# Patient Record
Sex: Female | Born: 1960 | Race: Black or African American | Hispanic: No | Marital: Married | Smoking: Never smoker
Health system: Southern US, Community
[De-identification: ages and names within clinical notes are randomized; demographics above are authoritative.]

## PROBLEM LIST (undated history)

## (undated) DIAGNOSIS — E785 Hyperlipidemia, unspecified: Secondary | ICD-10-CM

## (undated) DIAGNOSIS — I10 Essential (primary) hypertension: Secondary | ICD-10-CM

## (undated) DIAGNOSIS — Z9189 Other specified personal risk factors, not elsewhere classified: Secondary | ICD-10-CM

## (undated) HISTORY — PX: CHOLECYSTECTOMY: SHX55

## (undated) HISTORY — DX: Hyperlipidemia, unspecified: E78.5

## (undated) HISTORY — PX: ABDOMINAL HYSTERECTOMY: SHX81

## (undated) HISTORY — DX: Essential (primary) hypertension: I10

---

## 2013-04-30 LAB — HM MAMMOGRAPHY

## 2013-05-07 ENCOUNTER — Encounter: Payer: Self-pay | Admitting: Physician Assistant

## 2013-05-07 ENCOUNTER — Ambulatory Visit (INDEPENDENT_AMBULATORY_CARE_PROVIDER_SITE_OTHER): Payer: BC Managed Care – PPO | Admitting: Physician Assistant

## 2013-05-07 VITALS — BP 129/80 | HR 92 | Ht 64.0 in | Wt 211.0 lb

## 2013-05-07 DIAGNOSIS — E669 Obesity, unspecified: Secondary | ICD-10-CM

## 2013-05-07 DIAGNOSIS — E119 Type 2 diabetes mellitus without complications: Secondary | ICD-10-CM

## 2013-05-07 DIAGNOSIS — I10 Essential (primary) hypertension: Secondary | ICD-10-CM

## 2013-05-07 DIAGNOSIS — Z860100 Personal history of colon polyps, unspecified: Secondary | ICD-10-CM

## 2013-05-07 DIAGNOSIS — E785 Hyperlipidemia, unspecified: Secondary | ICD-10-CM

## 2013-05-07 DIAGNOSIS — R631 Polydipsia: Secondary | ICD-10-CM

## 2013-05-07 DIAGNOSIS — R7301 Impaired fasting glucose: Secondary | ICD-10-CM

## 2013-05-07 LAB — POCT GLYCOSYLATED HEMOGLOBIN (HGB A1C): HEMOGLOBIN A1C: 6.7

## 2013-05-07 NOTE — Patient Instructions (Signed)
Will refer to nutritionist.

## 2013-05-07 NOTE — Progress Notes (Signed)
   Subjective:    Patient ID: Brianna Walton, female    DOB: [birth year], 53 y.o.   MRN: [id number]  HPI Pt is a 53 yo female who presents to clinic to establish care. She just moved here from [place], [place]. She recently had CPE but feels like she needs to follow up on a few things. Her fasting glucose was elevated at 120 and her A1C was 6.1 a few months back. 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 She has noticed she feels thristy all the time.   Labs have been drawn LDL 130, HDL 36. Mammogram and Pap UPT. Colonoscopy done 2012. Polyps found recheck every 5 years.   . Active Ambulatory Problems    Diagnosis Date Noted  . Personal history of colonic polyps 05/07/2013  . Obesity, unspecified 05/08/2013  . Diabetes mellitus, type 2 05/08/2013  . Essential hypertension, benign 05/08/2013   Resolved Ambulatory Problems    Diagnosis Date Noted  . No Resolved Ambulatory Problems   Past Medical History  Diagnosis Date  . Hypertension   . Hyperlipidemia    . History   Social History  . Marital Status: Married    Spouse Name: N/A    Number of Children: N/A  . Years of Education: N/A   Occupational History  . Not on file.   Social History Main Topics  . Smoking status: Never Smoker   . Smokeless tobacco: Not on file  . Alcohol Use: No  . Drug Use: No  . Sexual Activity: Yes   Other Topics Concern  . Not on file   Social History Narrative  . No narrative on file   . Family History  Problem Relation Age of Onset  . Diabetes Mother   . Hypertension Mother   . Hypertension Father       Review of Systems  All other systems reviewed and are negative.      Objective:   Physical Exam  Constitutional: She is oriented to person, place, and time. She appears well-developed and well-nourished.  Obesity.   HENT:  Head: Normocephalic and atraumatic.  Cardiovascular: Normal rate, regular rhythm and normal heart sounds.   Pulmonary/Chest:  Effort normal and breath sounds normal.  Neurological: She is alert and oriented to person, place, and time.  Psychiatric: She has a normal mood and affect. Her behavior is normal.          Assessment & Plan:  Diabetes Mellitus- . Lab Results  Component Value Date   HGBA1C 6.7 05/07/2013  This has increased since last checked. Would like to start metformin 500mg  bid. Pt mentioned her kidney function was elevated at one point. Will check CMP first and then call in accordingly. Nutrition is a big component of diabetes. Will send to nutrition counseling. Follow up in 3 months    HTN- BP looks good today. Ok for refill for 6 months.   Vitamin D deficiency- will recheck today. Been on Vitamin d 50000 unitis for last 3-6 months.    Hyperlipidemia- due to new dx of diabetes need to consider statin for LDL reduction. Will discuss at next appt.

## 2013-05-08 ENCOUNTER — Telehealth: Payer: Self-pay | Admitting: Physician Assistant

## 2013-05-08 DIAGNOSIS — I10 Essential (primary) hypertension: Secondary | ICD-10-CM | POA: Insufficient documentation

## 2013-05-08 DIAGNOSIS — E669 Obesity, unspecified: Secondary | ICD-10-CM | POA: Insufficient documentation

## 2013-05-08 DIAGNOSIS — R7303 Prediabetes: Secondary | ICD-10-CM | POA: Insufficient documentation

## 2013-05-08 DIAGNOSIS — E785 Hyperlipidemia, unspecified: Secondary | ICD-10-CM | POA: Insufficient documentation

## 2013-05-08 LAB — COMPLETE METABOLIC PANEL WITH GFR
ALBUMIN: 4.5 g/dL (ref 3.5–5.2)
ALK PHOS: 91 U/L (ref 39–117)
ALT: 20 U/L (ref 0–35)
AST: 22 U/L (ref 0–37)
BILIRUBIN TOTAL: 0.6 mg/dL (ref 0.2–1.2)
BUN: 11 mg/dL (ref 6–23)
CO2: 25 mEq/L (ref 19–32)
Calcium: 9.5 mg/dL (ref 8.4–10.5)
Chloride: 100 mEq/L (ref 96–112)
Creat: 0.93 mg/dL (ref 0.50–1.10)
GFR, Est African American: 81 mL/min
GFR, Est Non African American: 70 mL/min
Glucose, Bld: 95 mg/dL (ref 70–99)
Potassium: 3.4 mEq/L — ABNORMAL LOW (ref 3.5–5.3)
SODIUM: 138 meq/L (ref 135–145)
Total Protein: 7.6 g/dL (ref 6.0–8.3)

## 2013-05-08 LAB — VITAMIN D 25 HYDROXY (VIT D DEFICIENCY, FRACTURES): Vit D, 25-Hydroxy: 54 ng/mL (ref 30–89)

## 2013-05-08 MED ORDER — METFORMIN HCL 500 MG PO TABS: 500.0000 mg | ORAL_TABLET | Freq: Two times a day (BID) | ORAL | Status: DC

## 2013-05-08 NOTE — Telephone Encounter (Signed)
Add to result note. Will send metformin since kidney function was fine. May have some GI side effects but if any muscle aches please call office.

## 2013-06-28 ENCOUNTER — Telehealth: Payer: Self-pay | Admitting: *Deleted

## 2013-06-28 NOTE — Telephone Encounter (Signed)
Stop metformin. See if sensation goes away. Let me know on Monday.

## 2013-06-28 NOTE — Telephone Encounter (Signed)
Left detailed message on vm.

## 2013-06-28 NOTE — Telephone Encounter (Signed)
Pt calls stating that ever since she's started the metformin she's experiencing tingling in her knees, especially when she stands up.  She said it "feels like bugs are crawling around".

## 2013-07-04 ENCOUNTER — Telehealth: Payer: Self-pay | Admitting: *Deleted

## 2013-07-04 NOTE — Telephone Encounter (Signed)
Pt called & left a vm stating that after stopping the metformin, she is still having the tingling sensation in her knees.

## 2013-07-05 NOTE — Telephone Encounter (Signed)
Forwarding to [employer], PA-C

## 2013-07-08 NOTE — Telephone Encounter (Signed)
Left detailed message on vm notifying pt.

## 2013-07-08 NOTE — Telephone Encounter (Signed)
Very unusal symptom. i would definetly start back on metformin. Have you started on any other new medication or even OTC. Follow up with appt to discuss in more detail. Could certainly get blood work to look for any other causes.

## 2013-07-10 ENCOUNTER — Ambulatory Visit (INDEPENDENT_AMBULATORY_CARE_PROVIDER_SITE_OTHER): Payer: Federal, State, Local not specified - PPO | Admitting: Physician Assistant

## 2013-07-10 ENCOUNTER — Ambulatory Visit (INDEPENDENT_AMBULATORY_CARE_PROVIDER_SITE_OTHER): Payer: Federal, State, Local not specified - PPO

## 2013-07-10 ENCOUNTER — Encounter: Payer: Self-pay | Admitting: Physician Assistant

## 2013-07-10 ENCOUNTER — Other Ambulatory Visit: Payer: Self-pay | Admitting: Physician Assistant

## 2013-07-10 VITALS — BP 125/82 | HR 101 | Ht 64.0 in | Wt 208.0 lb

## 2013-07-10 DIAGNOSIS — R202 Paresthesia of skin: Secondary | ICD-10-CM

## 2013-07-10 DIAGNOSIS — M25562 Pain in left knee: Principal | ICD-10-CM

## 2013-07-10 DIAGNOSIS — M25561 Pain in right knee: Secondary | ICD-10-CM

## 2013-07-10 DIAGNOSIS — R209 Unspecified disturbances of skin sensation: Secondary | ICD-10-CM

## 2013-07-10 DIAGNOSIS — M25569 Pain in unspecified knee: Secondary | ICD-10-CM

## 2013-07-10 LAB — CBC
HCT: 40.3 % (ref 36.0–46.0)
HEMOGLOBIN: 13.4 g/dL (ref 12.0–15.0)
MCH: 24 pg — AB (ref 26.0–34.0)
MCHC: 33.3 g/dL (ref 30.0–36.0)
MCV: 72.2 fL — ABNORMAL LOW (ref 78.0–100.0)
Platelets: 402 10*3/uL — ABNORMAL HIGH (ref 150–400)
RBC: 5.58 MIL/uL — AB (ref 3.87–5.11)
RDW: 15.8 % — ABNORMAL HIGH (ref 11.5–15.5)
WBC: 8.8 10*3/uL (ref 4.0–10.5)

## 2013-07-10 LAB — FOLATE: FOLATE: 7.3 ng/mL

## 2013-07-10 LAB — BASIC METABOLIC PANEL WITH GFR
BUN: 14 mg/dL (ref 6–23)
CALCIUM: 9.4 mg/dL (ref 8.4–10.5)
CHLORIDE: 101 meq/L (ref 96–112)
CO2: 29 meq/L (ref 19–32)
Creat: 1.1 mg/dL (ref 0.50–1.10)
GFR, Est African American: 66 mL/min
GFR, Est Non African American: 57 mL/min — ABNORMAL LOW
Glucose, Bld: 109 mg/dL — ABNORMAL HIGH (ref 70–99)
POTASSIUM: 3.3 meq/L — AB (ref 3.5–5.3)
SODIUM: 138 meq/L (ref 135–145)

## 2013-07-10 LAB — VITAMIN B12: Vitamin B-12: 1769 pg/mL — ABNORMAL HIGH (ref 211–911)

## 2013-07-10 LAB — TSH: TSH: 2.55 u[IU]/mL (ref 0.350–4.500)

## 2013-07-15 NOTE — Progress Notes (Signed)
   Subjective:    Patient ID: Brianna Walton, female    DOB: 09-28-1960, 53 y.o.   MRN: [id number]  HPI Pt is a 53 yo female who presents to the clinic with tingling in both knees. Start about 2 weeks ago after starting metformin for diabetes. She stopped metformin but both knees still tingle. Nothing makes better or worse. Denies any injury or pain. Sensation is constant.    Review of Systems  All other systems reviewed and are negative.      Objective:   Physical Exam  Constitutional: She is oriented to person, place, and time. She appears well-developed and well-nourished.  HENT:  Head: Normocephalic and atraumatic.  Cardiovascular: Normal rate, regular rhythm and normal heart sounds.   Pulmonary/Chest: Effort normal and breath sounds normal. She has no wheezes.  Musculoskeletal:  NROM of bilateral knees.  No pain with palpation bilaterally.  No swelling or deformity.  No joint tenderness bilaterally.  Strength 5/5.  Negative mcmurrays.  Bilateral knees stable with no laxity.   Neurological: She is alert and oriented to person, place, and time.  Skin: Skin is dry.  Psychiatric: She has a normal mood and affect. Her behavior is normal.          Assessment & Plan:  Tingling in knees- we originally thought tingling was coming from metformin. Pt has stopped for 2 weeks. Will check labs for b12, cbc, TSH, bmp. Will get knee xrays. Follow up in 2 weeks. Discussed trying neurontin or getting EMG's of legs.

## 2013-07-31 ENCOUNTER — Encounter: Payer: BC Managed Care – PPO | Admitting: Physician Assistant

## 2013-07-31 ENCOUNTER — Telehealth: Payer: Self-pay | Admitting: Physician Assistant

## 2013-07-31 NOTE — Progress Notes (Signed)
This encounter was created in error - please disregard.

## 2013-07-31 NOTE — Telephone Encounter (Signed)
Call pt: missed appt today. see if tingling in knees has improved. Due for A1C check.

## 2013-08-07 ENCOUNTER — Ambulatory Visit: Payer: BC Managed Care – PPO | Admitting: Physician Assistant

## 2013-08-08 ENCOUNTER — Encounter: Payer: Self-pay | Admitting: Sports Medicine

## 2013-08-08 ENCOUNTER — Ambulatory Visit (INDEPENDENT_AMBULATORY_CARE_PROVIDER_SITE_OTHER): Payer: Federal, State, Local not specified - PPO | Admitting: Sports Medicine

## 2013-08-08 ENCOUNTER — Ambulatory Visit (INDEPENDENT_AMBULATORY_CARE_PROVIDER_SITE_OTHER): Payer: Federal, State, Local not specified - PPO

## 2013-08-08 VITALS — BP 145/98 | HR 105 | Ht 64.0 in | Wt 207.0 lb

## 2013-08-08 DIAGNOSIS — M5416 Radiculopathy, lumbar region: Secondary | ICD-10-CM

## 2013-08-08 DIAGNOSIS — IMO0002 Reserved for concepts with insufficient information to code with codable children: Secondary | ICD-10-CM

## 2013-08-08 DIAGNOSIS — M545 Low back pain, unspecified: Secondary | ICD-10-CM

## 2013-08-08 MED ORDER — PREDNISONE 50 MG PO TABS: ORAL_TABLET | ORAL | Status: DC

## 2013-08-08 MED ORDER — MELOXICAM 15 MG PO TABS: ORAL_TABLET | ORAL | Status: DC

## 2013-08-08 NOTE — Assessment & Plan Note (Signed)
Steffani has classic L4 bilateral radicular symptoms with pain in her back and symptoms are worse in a seated position. Prednisone, Mobic, x-rays, formal PT. She will likely have L4-L5 degenerative disc disease. Return in 4 weeks If no better in a month we will proceed with an MRI.

## 2013-08-08 NOTE — Progress Notes (Signed)
   Subjective:    I'm seeing this patient as a consultation for:  Iran Planas, PA-C  CC: Knee tingling  HPI: This is a pleasant 53 year old female, for the past several months she's had tingling in the anterior aspect of the knees, she saw her PCP who ordered knee x-rays and some blood work which was negative. She was referred to me for further evaluation and definitive treatment. She describes her sensation is tingling on the anterior aspect of the knee with occasional radiation from the back, worse when sitting for long periods of time and worse with driving. She does have occasional back soreness. No bowel or bladder dysfunction or constitutional symptoms. Symptoms are mild, persistent.  Past medical history, Surgical history, Family history not pertinant except as noted below, Social history, Allergies, and medications have been entered into the medical record, reviewed, and no changes needed.   Review of Systems: No headache, visual changes, nausea, vomiting, diarrhea, constipation, dizziness, abdominal pain, skin rash, fevers, chills, night sweats, weight loss, swollen lymph nodes, body aches, joint swelling, muscle aches, chest pain, shortness of breath, mood changes, visual or auditory hallucinations.   Objective:   General: Well Developed, well nourished, and in no acute distress.  Neuro/Psych: Alert and oriented x3, extra-ocular muscles intact, able to move all 4 extremities, sensation grossly intact. Skin: Warm and dry, no rashes noted.  Respiratory: Not using accessory muscles, speaking in full sentences, trachea midline.  Cardiovascular: Pulses palpable, no extremity edema. Abdomen: Does not appear distended. Back Exam:  Inspection: Unremarkable  Motion: Flexion 45 deg, Extension 45 deg, Side Bending to 45 deg bilaterally,  Rotation to 45 deg bilaterally  SLR laying: Negative  XSLR laying: Negative  Palpable tenderness: None. FABER: negative. Sensory change: Gross  sensation intact to all lumbar and sacral dermatomes.  Reflexes: 2+ at both patellar tendons, 2+ at achilles tendons, Babinski's downgoing.  Strength at foot  Plantar-flexion: 5/5 Dorsi-flexion: 5/5 Eversion: 5/5 Inversion: 5/5  Leg strength  Quad: 5/5 Hamstring: 5/5 Hip flexor: 5/5 Hip abductors: 5/5  Gait unremarkable.  X-rays reviewed and showed a worse changes in the upper lumbar segments.  Impression and Recommendations:   This case required medical decision making of moderate complexity.

## 2013-08-08 NOTE — Telephone Encounter (Signed)
Pt has appt

## 2013-09-05 ENCOUNTER — Ambulatory Visit: Payer: Federal, State, Local not specified - PPO | Admitting: Sports Medicine

## 2013-09-13 ENCOUNTER — Ambulatory Visit: Payer: Federal, State, Local not specified - PPO | Admitting: Sports Medicine

## 2013-09-13 DIAGNOSIS — Z0289 Encounter for other administrative examinations: Secondary | ICD-10-CM

## 2013-10-15 ENCOUNTER — Other Ambulatory Visit: Payer: Self-pay | Admitting: Physician Assistant

## 2013-11-14 ENCOUNTER — Other Ambulatory Visit: Payer: Self-pay | Admitting: Physician Assistant

## 2013-11-15 ENCOUNTER — Other Ambulatory Visit: Payer: Self-pay | Admitting: Physician Assistant

## 2013-11-17 ENCOUNTER — Other Ambulatory Visit: Payer: Self-pay | Admitting: Physician Assistant

## 2013-12-31 ENCOUNTER — Encounter: Payer: Self-pay | Admitting: Physician Assistant

## 2013-12-31 ENCOUNTER — Ambulatory Visit (INDEPENDENT_AMBULATORY_CARE_PROVIDER_SITE_OTHER): Payer: Federal, State, Local not specified - PPO | Admitting: Physician Assistant

## 2013-12-31 VITALS — BP 144/99 | HR 100 | Ht 64.0 in | Wt 212.0 lb

## 2013-12-31 DIAGNOSIS — R809 Proteinuria, unspecified: Secondary | ICD-10-CM | POA: Diagnosis not present

## 2013-12-31 DIAGNOSIS — E876 Hypokalemia: Secondary | ICD-10-CM | POA: Diagnosis not present

## 2013-12-31 DIAGNOSIS — R319 Hematuria, unspecified: Secondary | ICD-10-CM

## 2013-12-31 DIAGNOSIS — R7989 Other specified abnormal findings of blood chemistry: Secondary | ICD-10-CM

## 2013-12-31 DIAGNOSIS — R748 Abnormal levels of other serum enzymes: Secondary | ICD-10-CM | POA: Diagnosis not present

## 2013-12-31 LAB — BASIC METABOLIC PANEL WITH GFR
BUN: 14 mg/dL (ref 6–23)
CALCIUM: 9.3 mg/dL (ref 8.4–10.5)
CO2: 25 mEq/L (ref 19–32)
Chloride: 101 mEq/L (ref 96–112)
Creat: 1.06 mg/dL (ref 0.50–1.10)
GFR, Est African American: 69 mL/min
GFR, Est Non African American: 60 mL/min
GLUCOSE: 116 mg/dL — AB (ref 70–99)
POTASSIUM: 3.8 meq/L (ref 3.5–5.3)
SODIUM: 138 meq/L (ref 135–145)

## 2013-12-31 LAB — POCT URINALYSIS DIPSTICK
BILIRUBIN UA: NEGATIVE
Glucose, UA: NEGATIVE
KETONES UA: NEGATIVE
LEUKOCYTES UA: NEGATIVE
NITRITE UA: NEGATIVE
PH UA: 7
Protein, UA: 100
Spec Grav, UA: 1.015
Urobilinogen, UA: 0.2

## 2013-12-31 LAB — VITAMIN B12: VITAMIN B 12: 813 pg/mL (ref 211–911)

## 2013-12-31 MED ORDER — CLONIDINE HCL 0.1 MG PO TABS: 0.1000 mg | ORAL_TABLET | Freq: Two times a day (BID) | ORAL | Status: DC

## 2013-12-31 MED ORDER — AMLODIPINE BESY-BENAZEPRIL HCL 10-40 MG PO CAPS: ORAL_CAPSULE | ORAL | Status: DC

## 2013-12-31 NOTE — Patient Instructions (Signed)
Proteinuria Proteinuria is a condition in which urine contains more protein than is normal. Proteinuria is either a sign that your body is producing too much protein or a sign that there is a problem with the kidneys. Healthy kidneys prevent most substances that the body needs, including proteins, from leaving the bloodstream and ending up in urine. CAUSES  Proteinuria may be caused by a temporary event or condition such as stress, exercise, or fever, and go away on its own. Proteinuria may also be a symptom of a more serious condition or disease. Causes of proteinuria include:  A kidney disease caused by:  Diabetes.  High blood pressure (hypertension).   A disease that affects the immune system, such as lupus.  A genetic disease, such as Alport's syndrome.  Medicines that damage the kidneys, such as long-term nonsteroidal anti-inflammatory drugs (NSAIDs).  Poisoning or exposure to toxic substances.  A reoccurring kidney or urinary infection.  Excess protein production in the body caused by:  Multiple myeloma.  Amyloidosis. SYMPTOMS You may have proteinuria without having noticeable symptoms. If there is a large amount of protein in your urine, your urine may look foamy. You may also notice swelling (edema) in your hands, feet, abdomen, or face. DIAGNOSIS To determine whether you have proteinuria, you will need to provide a urine sample. Your urine will then be tested for too much protein and the main blood protein albumin. If your test shows that you have proteinuria, you may need to take additional tests to determine its cause, how much protein is in your urine, and what type of protein is being lost. Tests may include:  Blood tests.  Urine tests.  A blood pressure measurement.  Imaging tests. TREATMENT  Treatment will depend on the cause of your proteinuria. Your caregiver will discuss treatment options with you after you have been diagnosed. If your proteinuria is mild or  temporary, no treatment may be necessary. HOME CARE INSTRUCTIONS Ask your caregiver if monitoring the level of protein in your urine at home using simple testing strips is appropriate for you. Early detection of proteinuria can lead to early and often successful treatment of the condition causing it. Document Released: 02/23/2005 Document Revised: 09/28/2011 Document Reviewed: 06/03/2011 Exit[employer] Patient Information 2015 [employer]. This information is not intended to replace advice given to you by your health care provider. Make sure you discuss any questions you have with your health care provider.  

## 2013-12-31 NOTE — Progress Notes (Signed)
   Subjective:    Patient ID: Brianna Walton, female    DOB: 06-04-1960, 53 y.o.   MRN: [id number]  HPI Patient is a 53 year old female who presents to the clinic to follow-up on low potassium and elevated B12 at last visit as well as discuss proteins that she found in her urine when she tested her urine on dipstick. She denies any urinary symptoms. She denies any fever or chills.   Review of Systems  All other systems reviewed and are negative.      Objective:   Physical Exam  Constitutional: She is oriented to person, place, and time. She appears well-developed and well-nourished.  HENT:  Head: Normocephalic and atraumatic.  Cardiovascular: Normal rate, regular rhythm and normal heart sounds.   Pulmonary/Chest: Effort normal and breath sounds normal.  Neurological: She is alert and oriented to person, place, and time.  Psychiatric: She has a normal mood and affect. Her behavior is normal.          Assessment & Plan:  Elevated B12/hypokalemia-we'll recheck labs today.  Proteinuria/hematuria-patient is not symptomatic today will culture urine and get microscopic. Discussed there are multiple causes for protein in urine. Her kidney function look great at last visit will recheck. We'll also recheck urine in 2 weeks. Her blood pressure was elevated today. Discuss hypertension can sometimes also cause protein in urine. Patient is she's taken her blood pressure medicine daily and keeping to a low salt diet.

## 2014-01-01 LAB — URINALYSIS, MICROSCOPIC ONLY
Casts: NONE SEEN
Crystals: NONE SEEN
SQUAMOUS EPITHELIAL / LPF: NONE SEEN

## 2014-01-03 ENCOUNTER — Other Ambulatory Visit: Payer: Self-pay | Admitting: Physician Assistant

## 2014-01-03 ENCOUNTER — Other Ambulatory Visit: Payer: Self-pay | Admitting: *Deleted

## 2014-01-03 LAB — URINE CULTURE: Colony Count: 75000

## 2014-01-03 MED ORDER — CLONIDINE HCL 0.2 MG PO TABS: 0.2000 mg | ORAL_TABLET | Freq: Two times a day (BID) | ORAL | Status: DC

## 2014-01-03 MED ORDER — CIPROFLOXACIN HCL 500 MG PO TABS: 500.0000 mg | ORAL_TABLET | Freq: Two times a day (BID) | ORAL | Status: DC

## 2014-04-08 ENCOUNTER — Emergency Department
Admission: EM | Admit: 2014-04-08 | Discharge: 2014-04-08 | Disposition: A | Discharge status: Home / Self Care | Payer: Federal, State, Local not specified - PPO | Source: Home / Self Care | Attending: Emergency Medicine | Admitting: Emergency Medicine

## 2014-04-08 ENCOUNTER — Encounter: Payer: Self-pay | Admitting: *Deleted

## 2014-04-08 ENCOUNTER — Emergency Department (INDEPENDENT_AMBULATORY_CARE_PROVIDER_SITE_OTHER): Payer: Federal, State, Local not specified - PPO

## 2014-04-08 DIAGNOSIS — M25562 Pain in left knee: Secondary | ICD-10-CM

## 2014-04-08 DIAGNOSIS — S8002XA Contusion of left knee, initial encounter: Secondary | ICD-10-CM

## 2014-04-08 NOTE — ED Provider Notes (Signed)
CSN: [id number]     Arrival date & time 04/08/14  1329 History   First MD Initiated Contact with Patient 04/08/14 1350     Chief Complaint  Patient presents with  . Knee Pain   (Consider location/radiation/quality/duration/timing/severity/associated sxs/prior Treatment) HPI Accidentally fell from second to last step while walking down stairs, landing on left knee. As been painful and swollen ever since, but able to weight-bear. The pain is sharp and dull, 5 out of 10 at rest, 8 out of 10 with movement and to palpation. No paresthesias or focal weakness. No cardiorespiratory symptoms. No nausea or vomiting. She had a separate accidental injury of left knee about a month ago with negative x-ray, but that had completely resolved Past Medical History  Diagnosis Date  . Hypertension   . Hyperlipidemia    Past Surgical History  Procedure Laterality Date  . Abdominal hysterectomy    . Cholecystectomy     Family History  Problem Relation Age of Onset  . Diabetes Mother   . Hypertension Mother   . Hypertension Father    History  Substance Use Topics  . Smoking status: Never Smoker   . Smokeless tobacco: Not on file  . Alcohol Use: No   OB History    No data available     Review of Systems  All other systems reviewed and are negative.   Allergies  Atenolol; Hctz; Penicillins; and Spironolactone  Home Medications   Prior to Admission medications   Medication Sig Start Date End Date Taking? Authorizing Provider  amLODipine-benazepril (LOTREL) 10-40 MG per capsule Take 1 oral capsule daily 12/31/13   Jade L Breeback, PA-C  ciprofloxacin (CIPRO) 500 MG tablet Take 1 tablet (500 mg total) by mouth 2 (two) times daily. 01/03/14   Jade L Breeback, PA-C  cloNIDine (CATAPRES) 0.2 MG tablet Take 1 tablet (0.2 mg total) by mouth 2 (two) times daily. 01/03/14   Jade L Breeback, PA-C  meloxicam (MOBIC) 15 MG tablet One tab PO qAM with breakfast for 2 weeks, then daily prn pain. 08/08/13    Silverio Decamp, MD  Vitamin D, Ergocalciferol, (DRISDOL) 50000 UNITS CAPS capsule Take 50,000 Units by mouth 3 (three) times a week.    Historical Provider, MD   BP 137/88 mmHg  Pulse 99  Temp(Src) 98.3 F (36.8 C) (Oral)  Resp 18  Ht 5\' 4"  (1.626 m)  Wt 205 lb (92.987 kg)  BMI 35.17 kg/m2  SpO2 100% Physical Exam  Constitutional: She is oriented to person, place, and time. She appears well-developed and well-nourished. No distress.  Uncomfortable from left knee pain  HENT:  Head: Normocephalic and atraumatic.  Eyes: Conjunctivae and EOM are normal. Pupils are equal, round, and reactive to light. No scleral icterus.  Neck: Normal range of motion.  Cardiovascular: Normal rate.   Pulmonary/Chest: Effort normal.  Abdominal: She exhibits no distension.  Musculoskeletal:       Left knee: She exhibits decreased range of motion, swelling and bony tenderness. She exhibits no ecchymosis, no laceration and normal patellar mobility. Tenderness found. Patellar tendon tenderness noted. No medial joint line and no lateral joint line tenderness noted.  Exquisitely tender inferior patella and especially just inferior to patella. She can extend her knee, but with pain. Decreased range of motion. No definite instability. No ecchymosis. No open wound. No calf tenderness. No pretibial edema. Neurovascular distally intact  Neurological: She is alert and oriented to person, place, and time.  Skin: Skin is warm. No bruising and  no rash noted.  Psychiatric: She has a normal mood and affect.  Nursing note and vitals reviewed.   ED Course  Procedures (including critical care time) Labs Review Labs Reviewed - No data to display  Imaging Review Dg Knee Complete 4 Views Left  04/08/2014   CLINICAL DATA:  Lateral knee pain post fall today  EXAM: LEFT KNEE - COMPLETE 4+ VIEW  COMPARISON:  07/10/2013  FINDINGS: Four views of the left knee submitted. No acute fracture or subluxation. There is  minimal spurring of medial and lateral tibial plateau. Mild spurring of lateral femoral condyle. Mild spurring of patella.  IMPRESSION: No acute fracture or subluxation.  Minimal degenerative changes.   Electronically Signed   By: Lahoma Crocker M.D.   On: 04/08/2014 14:46     MDM   1. Contusion, knee, left, initial encounter    x-ray shows no acute abnormalities Treatment options discussed, as well as risks, benefits, alternatives. Patient voiced understanding and agreement with the following plans:  Encourage rest, ice, compression with ACE bandage, and elevation of injured body part. Elastic knee brace Other symptomatic care discussed Tylenol or ibuprofen as needed for pain She declined prescription pain meds Follow-up with Dr. Dianah Field within 1 week Precautions discussed. Red flags discussed. Questions invited and answered. Patient voiced understanding and agreement.    Jacqulyn Cane, MD 04/08/14 [id number]

## 2014-04-08 NOTE — ED Notes (Signed)
Pt c/o LT knee pain x last night post fall.

## 2014-05-26 ENCOUNTER — Encounter: Payer: Self-pay | Admitting: Family Medicine

## 2014-05-26 ENCOUNTER — Ambulatory Visit (INDEPENDENT_AMBULATORY_CARE_PROVIDER_SITE_OTHER): Payer: Federal, State, Local not specified - PPO | Admitting: Family Medicine

## 2014-05-26 VITALS — BP 140/96 | HR 83 | Temp 98.4°F | Wt 214.0 lb

## 2014-05-26 DIAGNOSIS — R300 Dysuria: Secondary | ICD-10-CM

## 2014-05-26 DIAGNOSIS — R809 Proteinuria, unspecified: Secondary | ICD-10-CM | POA: Diagnosis not present

## 2014-05-26 LAB — POCT URINALYSIS DIPSTICK
Bilirubin, UA: NEGATIVE
GLUCOSE UA: NEGATIVE
Ketones, UA: NEGATIVE
Leukocytes, UA: NEGATIVE
Nitrite, UA: NEGATIVE
Protein, UA: 100
SPEC GRAV UA: 1.02
Urobilinogen, UA: 1
pH, UA: 7

## 2014-05-26 MED ORDER — SULFAMETHOXAZOLE-TRIMETHOPRIM 800-160 MG PO TABS: 1.0000 | ORAL_TABLET | Freq: Two times a day (BID) | ORAL | Status: DC

## 2014-05-26 NOTE — Progress Notes (Signed)
CC: Brianna Walton is a 54 y.o. female is here for Dysuria   Subjective: HPI:  Complains of dysuria and urinary frequency that have been present for the past 3 days. Interventions have included increasing fluids and cranberry juice. No benefit as of yet. Nothing seems to make symptoms better or worse they're moderate in severity. She denies any flank pain, confusion, nausea, fevers, chills or back pain. No other genitourinary complaints other than noticing protein in her urine when she tests for it at home which has been going on for matter of months.  Review Of Systems Outlined In HPI  Past Medical History  Diagnosis Date  . Hypertension   . Hyperlipidemia     Past Surgical History  Procedure Laterality Date  . Abdominal hysterectomy    . Cholecystectomy     Family History  Problem Relation Age of Onset  . Diabetes Mother   . Hypertension Mother   . Hypertension Father     History   Social History  . Marital Status: Married    Spouse Name: N/A  . Number of Children: N/A  . Years of Education: N/A   Occupational History  . Not on file.   Social History Main Topics  . Smoking status: Never Smoker   . Smokeless tobacco: Not on file  . Alcohol Use: No  . Drug Use: No  . Sexual Activity: Yes   Other Topics Concern  . Not on file   Social History Narrative     Objective: BP 140/96 mmHg  Pulse 83  Temp(Src) 98.4 F (36.9 C) (Oral)  Wt 214 lb (97.07 kg)  Vital signs reviewed. General: Alert and Oriented, No Acute Distress HEENT: Pupils equal, round, reactive to light. Conjunctivae clear.  External ears unremarkable.  Moist mucous membranes. Lungs: Clear and comfortable work of breathing, speaking in full sentences without accessory muscle use. Cardiac: Regular rate and rhythm.  Neuro: CN II-XII grossly intact, gait normal. Extremities: No peripheral edema.  Strong peripheral pulses.  Mental Status: No depression, anxiety, nor agitation. Logical though  process. Skin: Warm and dry. Assessment & Plan: Ardine was seen today for dysuria.  Diagnoses and all orders for this visit:  Dysuria Orders: -     Urinalysis Dipstick -     Urine Culture -     sulfamethoxazole-trimethoprim (BACTRIM DS,SEPTRA DS) 800-160 MG per tablet; Take 1 tablet by mouth 2 (two) times daily.  Proteinuria Orders: -     Microalbumin / creatinine urine ratio   Dysuria with urinalysis suggestive of UTI therefore start Septra pending culture results. Proteinuria: Discussed the protein can be found in the urine during an active infection with a UTI therefore approximate 1 week after she is completely asymptomatic have a microalbumin to creatinine ratio checked, lab slip was provided to her today.  No Follow-up on file.

## 2014-05-28 LAB — URINE CULTURE
COLONY COUNT: NO GROWTH
ORGANISM ID, BACTERIA: NO GROWTH

## 2014-06-06 ENCOUNTER — Other Ambulatory Visit: Payer: Self-pay | Admitting: Family Medicine

## 2014-06-07 LAB — MICROALBUMIN / CREATININE URINE RATIO
CREATININE, URINE: 137.4 mg/dL
MICROALB UR: 48.6 mg/dL — AB (ref <2.0)
Microalb Creat Ratio: 353.7 mg/g — ABNORMAL HIGH (ref 0.0–30.0)

## 2014-06-08 LAB — URINE CULTURE
Colony Count: NO GROWTH
Organism ID, Bacteria: NO GROWTH

## 2014-06-09 ENCOUNTER — Telehealth: Payer: Self-pay | Admitting: Family Medicine

## 2014-06-09 DIAGNOSIS — R809 Proteinuria, unspecified: Secondary | ICD-10-CM | POA: Insufficient documentation

## 2014-06-09 NOTE — Telephone Encounter (Signed)
Brianna Walton, Will you please let patient know that her urine test confirmed an abnormal amount of protein in her urine therefore I'd recommend she meet with a nephrologist for further workup.  A referral has been placed.

## 2014-06-09 NOTE — Telephone Encounter (Signed)
Left message on vm

## 2014-07-02 ENCOUNTER — Other Ambulatory Visit: Payer: Self-pay | Admitting: Physician Assistant

## 2014-07-14 ENCOUNTER — Other Ambulatory Visit: Payer: Self-pay | Admitting: Physician Assistant

## 2014-07-15 ENCOUNTER — Other Ambulatory Visit: Payer: Self-pay | Admitting: Physician Assistant

## 2014-08-14 ENCOUNTER — Encounter: Payer: Self-pay | Admitting: *Deleted

## 2014-08-15 ENCOUNTER — Ambulatory Visit: Payer: Federal, State, Local not specified - PPO | Admitting: Family Medicine

## 2014-08-15 DIAGNOSIS — D490 Neoplasm of unspecified behavior of digestive system: Secondary | ICD-10-CM | POA: Insufficient documentation

## 2014-08-19 ENCOUNTER — Other Ambulatory Visit: Payer: Self-pay | Admitting: Family Medicine

## 2014-08-20 ENCOUNTER — Other Ambulatory Visit: Payer: Self-pay | Admitting: Family Medicine

## 2014-09-08 ENCOUNTER — Ambulatory Visit (INDEPENDENT_AMBULATORY_CARE_PROVIDER_SITE_OTHER): Payer: Federal, State, Local not specified - PPO | Admitting: Physician Assistant

## 2014-09-08 ENCOUNTER — Encounter: Payer: Self-pay | Admitting: Physician Assistant

## 2014-09-08 VITALS — BP 132/87 | HR 91 | Ht 64.0 in | Wt 200.0 lb

## 2014-09-08 DIAGNOSIS — N182 Chronic kidney disease, stage 2 (mild): Secondary | ICD-10-CM | POA: Diagnosis not present

## 2014-09-08 DIAGNOSIS — R809 Proteinuria, unspecified: Secondary | ICD-10-CM | POA: Diagnosis not present

## 2014-09-08 DIAGNOSIS — E118 Type 2 diabetes mellitus with unspecified complications: Secondary | ICD-10-CM

## 2014-09-08 DIAGNOSIS — R7309 Other abnormal glucose: Secondary | ICD-10-CM

## 2014-09-08 DIAGNOSIS — E559 Vitamin D deficiency, unspecified: Secondary | ICD-10-CM

## 2014-09-08 DIAGNOSIS — R7303 Prediabetes: Secondary | ICD-10-CM

## 2014-09-08 LAB — POCT GLYCOSYLATED HEMOGLOBIN (HGB A1C): Hemoglobin A1C: 6.3

## 2014-09-08 MED ORDER — CLONIDINE HCL 0.2 MG PO TABS: 0.2000 mg | ORAL_TABLET | Freq: Two times a day (BID) | ORAL | Status: DC

## 2014-09-08 MED ORDER — AMLODIPINE BESY-BENAZEPRIL HCL 10-40 MG PO CAPS: 1.0000 | ORAL_CAPSULE | Freq: Every day | ORAL | Status: DC

## 2014-09-08 NOTE — Progress Notes (Signed)
   Subjective:    Patient ID: Brianna Walton, female    DOB: [birth year], 54 y.o.   MRN: [id number]  HPI  Pt presents to the clinic for follow up.  She was sent to nephrology for evaluation of proteinuria. After ultrasounds and bloodwork no known cause of proteinuria. Suspect could be from DM although A!C never been below 7. They suggested close BP control as well as DM control. They started on spironolactone and wanted close monitoring of potassium.she was also started on labetalol but she stopped because sent her BP higher.  We tried metformin and though it caused some tingling of extremities so she stopped it. However she continues to have sensation and has been dx with lumbar radiculitis.She has tried to work on diet and started exercising. She has lost 15 pounds.    Review of Systems  All other systems reviewed and are negative.      Objective:   Physical Exam  Constitutional: She is oriented to person, place, and time. She appears well-developed and well-nourished.  HENT:  Head: Normocephalic and atraumatic.  Neck: Normal range of motion. Neck supple. No thyromegaly present.  Cardiovascular: Normal rate, regular rhythm and normal heart sounds.   Pulmonary/Chest: Effort normal and breath sounds normal.  Neurological: She is alert and oriented to person, place, and time.  Psychiatric: She has a normal mood and affect. Her behavior is normal.          Assessment & Plan:  Proteinuria/pre-diabetes-unclear etiology.A!C 6.3 under 6.5. Discussed restarting metformin but concerned with kidney disease could have adverse event with lactic acidosis. Will check bmp today and determine if would restart metformin. Recheck a1c in 3 months.    Achy- will check PTH today.   HTN- goal under 130/90. She is borderline today. Not able to tolerate BB. Pt is on ACE, CCB, and alpha antagonist. On spironactone which can help. Will get bmp today. Will not change anything today. Recheck BP in 4 weeks.

## 2014-09-09 LAB — BASIC METABOLIC PANEL WITH GFR
BUN: 21 mg/dL (ref 7–25)
CO2: 24 mmol/L (ref 20–31)
Calcium: 9.5 mg/dL (ref 8.6–10.4)
Chloride: 103 mmol/L (ref 98–110)
Creat: 1.06 mg/dL — ABNORMAL HIGH (ref 0.50–1.05)
GFR, Est African American: 68 mL/min (ref >=60)
GFR, Est Non African American: 59 mL/min — ABNORMAL LOW (ref >=60)
GLUCOSE: 85 mg/dL (ref 65–99)
POTASSIUM: 4 mmol/L (ref 3.5–5.3)
Sodium: 138 mmol/L (ref 135–146)

## 2014-09-09 LAB — VITAMIN D 25 HYDROXY (VIT D DEFICIENCY, FRACTURES): Vit D, 25-Hydroxy: 25 ng/mL — ABNORMAL LOW (ref 30–100)

## 2014-09-09 LAB — PTH, INTACT AND CALCIUM
Calcium: 9.5 mg/dL (ref 8.4–10.5)
PTH: 86 pg/mL — AB (ref 14–64)

## 2014-09-10 ENCOUNTER — Other Ambulatory Visit: Payer: Self-pay | Admitting: Physician Assistant

## 2014-09-10 DIAGNOSIS — N182 Chronic kidney disease, stage 2 (mild): Secondary | ICD-10-CM | POA: Insufficient documentation

## 2014-09-10 DIAGNOSIS — E559 Vitamin D deficiency, unspecified: Secondary | ICD-10-CM | POA: Insufficient documentation

## 2014-09-10 DIAGNOSIS — R809 Proteinuria, unspecified: Secondary | ICD-10-CM | POA: Insufficient documentation

## 2014-09-10 MED ORDER — VITAMIN D (ERGOCALCIFEROL) 1.25 MG (50000 UNIT) PO CAPS: 50000.0000 [IU] | ORAL_CAPSULE | ORAL | Status: DC

## 2014-09-10 MED ORDER — METFORMIN HCL 1000 MG PO TABS: 1000.0000 mg | ORAL_TABLET | Freq: Two times a day (BID) | ORAL | Status: DC

## 2014-09-11 ENCOUNTER — Encounter: Payer: Self-pay | Admitting: Physician Assistant

## 2015-03-18 ENCOUNTER — Other Ambulatory Visit: Payer: Self-pay | Admitting: Physician Assistant

## 2015-03-18 ENCOUNTER — Telehealth: Payer: Self-pay | Admitting: Physician Assistant

## 2015-03-18 ENCOUNTER — Encounter: Payer: Self-pay | Admitting: Family Medicine

## 2015-03-18 NOTE — Telephone Encounter (Signed)
Ok

## 2015-03-18 NOTE — Telephone Encounter (Signed)
Brianna Walton,  Pt would like to switch to Dr Ileene Rubens. Is this okay with  both of you?

## 2015-03-18 NOTE — Telephone Encounter (Signed)
Ok with me as long as her fist visit is scheduled as a 30 minute office visit to review her past medical history and chronic conditions like hypertension and blood sugar.

## 2015-03-23 ENCOUNTER — Encounter: Payer: Self-pay | Admitting: Family Medicine

## 2015-03-23 ENCOUNTER — Ambulatory Visit (INDEPENDENT_AMBULATORY_CARE_PROVIDER_SITE_OTHER): Payer: Federal, State, Local not specified - PPO | Admitting: Family Medicine

## 2015-03-23 VITALS — BP 152/88 | HR 80 | Wt 199.0 lb

## 2015-03-23 DIAGNOSIS — N182 Chronic kidney disease, stage 2 (mild): Secondary | ICD-10-CM | POA: Diagnosis not present

## 2015-03-23 DIAGNOSIS — R7303 Prediabetes: Secondary | ICD-10-CM | POA: Diagnosis not present

## 2015-03-23 DIAGNOSIS — I10 Essential (primary) hypertension: Secondary | ICD-10-CM

## 2015-03-23 MED ORDER — METOPROLOL SUCCINATE ER 50 MG PO TB24: 50.0000 mg | ORAL_TABLET | Freq: Every day | ORAL | Status: DC

## 2015-03-23 MED ORDER — METFORMIN HCL 1000 MG PO TABS: 500.0000 mg | ORAL_TABLET | Freq: Two times a day (BID) | ORAL | Status: DC

## 2015-03-23 NOTE — Progress Notes (Signed)
CC: Brianna Walton is a 55 y.o. female is here for Medical Management of Chronic Issues and Medication Management   Subjective: HPI:  Follow-up prediabetes: no outside blood sugars report. She's been taking 1 g of metformin on a daily basis, she had intolerable side effects when she took a gram twice a day. She denies polyuria polyphagia polydipsia but has had dry mouth ever since she was started on clonidine 2 years ago. Denies any motor or sensory disturbances.  She is a history of chronic kidney disease and currently on an ACE inhibitor. Her goal blood pressure should be a low 130/90 per recommendations of her nephrologist. No outside blood pressures to report. She denies edema or orthopnea. No shortness of breath chest pain or limb claudication   Review Of Systems Outlined In HPI  Past Medical History  Diagnosis Date  . Hypertension   . Hyperlipidemia     Past Surgical History  Procedure Laterality Date  . Abdominal hysterectomy    . Cholecystectomy     Family History  Problem Relation Age of Onset  . Diabetes Mother   . Hypertension Mother   . Hypertension Father     Social History   Social History  . Marital Status: Married    Spouse Name: N/A  . Number of Children: N/A  . Years of Education: N/A   Occupational History  . Not on file.   Social History Main Topics  . Smoking status: Never Smoker   . Smokeless tobacco: Not on file  . Alcohol Use: No  . Drug Use: No  . Sexual Activity: Yes   Other Topics Concern  . Not on file   Social History Narrative     Objective: BP 152/88 mmHg  Pulse 80  Wt 199 lb (90.266 kg)  General: Alert and Oriented, No Acute Distress HEENT: Pupils equal, round, reactive to light. Conjunctivae clear.  Moist mucous membranes Lungs: Clear to auscultation bilaterally, no wheezing/ronchi/rales.  Comfortable work of breathing. Good air movement. Cardiac: Regular rate and rhythm. Normal S1/S2.  No murmurs, rubs, nor gallops.    Extremities: No peripheral edema.  Strong peripheral pulses.  Mental Status: No depression, anxiety, nor agitation. Skin: Warm and dry.  Assessment & Plan: Brianna Walton was seen today for medical management of chronic issues and medication management.  Diagnoses and all orders for this visit:  Prediabetes -     POCT HgB A1C  Chronic kidney disease, stage II (mild)  Essential hypertension, benign  Other orders -     metFORMIN (GLUCOPHAGE) 1000 MG tablet; Take 0.5 tablets (500 mg total) by mouth 2 (two) times daily. -     metoprolol succinate (TOPROL-XL) 50 MG 24 hr tablet; Take 1 tablet (50 mg total) by mouth daily. Discontinue clonidine.   Pre- diabetes: A1c of 6.1, control,encourage her to cut metformin into 500 in the morning and 500 in the evening to help minimize an upset stomach that is occasionally bothering her. Essential hypertension: Uncontrolled chronic condition switching from clonidine to metoprolol, continue on Lotrel.  Return in about 3 months (around 06/23/2015).

## 2015-04-13 ENCOUNTER — Encounter: Payer: Self-pay | Admitting: Family Medicine

## 2015-04-13 ENCOUNTER — Other Ambulatory Visit: Payer: Self-pay | Admitting: Physician Assistant

## 2015-04-14 MED ORDER — CARVEDILOL 12.5 MG PO TABS: 12.5000 mg | ORAL_TABLET | Freq: Two times a day (BID) | ORAL | Status: DC

## 2015-04-14 NOTE — Telephone Encounter (Signed)
Not Jade's pt.

## 2015-05-29 ENCOUNTER — Ambulatory Visit: Payer: Federal, State, Local not specified - PPO | Admitting: Family Medicine

## 2015-06-01 ENCOUNTER — Encounter: Payer: Self-pay | Admitting: Family Medicine

## 2015-06-01 ENCOUNTER — Ambulatory Visit (INDEPENDENT_AMBULATORY_CARE_PROVIDER_SITE_OTHER): Payer: Federal, State, Local not specified - PPO | Admitting: Family Medicine

## 2015-06-01 VITALS — BP 129/87 | HR 85 | Wt 200.0 lb

## 2015-06-01 DIAGNOSIS — M5416 Radiculopathy, lumbar region: Secondary | ICD-10-CM | POA: Diagnosis not present

## 2015-06-01 MED ORDER — PREDNISONE 20 MG PO TABS: ORAL_TABLET | ORAL | Status: AC

## 2015-06-01 NOTE — Progress Notes (Signed)
CC: Brianna Walton is a 55 y.o. female is here for Leg Pain   Subjective: HPI:  Left lateral shin pain present for the past week. It's worse with walking or bending forward. Improves with rest. Joined by back stiffness in the lumbar region of sitting for long periods of time. No benefit from walking, stretching and she denies any recent or remote trauma. No overlying skin changes or swelling of the leg. She denies any other motor or sensory disturbances other than pain. Pain does not involve the ankle or the knee. Symptoms are mild in severity. No interventions as of yet   Review Of Systems Outlined In HPI  Past Medical History  Diagnosis Date  . Hypertension   . Hyperlipidemia     Past Surgical History  Procedure Laterality Date  . Abdominal hysterectomy    . Cholecystectomy     Family History  Problem Relation Age of Onset  . Diabetes Mother   . Hypertension Mother   . Hypertension Father     Social History   Social History  . Marital Status: Married    Spouse Name: N/A  . Number of Children: N/A  . Years of Education: N/A   Occupational History  . Not on file.   Social History Main Topics  . Smoking status: Never Smoker   . Smokeless tobacco: Not on file  . Alcohol Use: No  . Drug Use: No  . Sexual Activity: Yes   Other Topics Concern  . Not on file   Social History Narrative     Objective: BP 129/87 mmHg  Pulse 85  Wt 200 lb (90.719 kg)  Vital signs reviewed. General: Alert and Oriented, No Acute Distress HEENT: Pupils equal, round, reactive to light. Conjunctivae clear.  External ears unremarkable.  Moist mucous membranes. Lungs: Clear and comfortable work of breathing, speaking in full sentences without accessory muscle use. Cardiac: Regular rate and rhythm.  Neuro: CN II-XII grossly intact, gait normal. Extremities: No peripheral edema.  Strong peripheral pulses. Full range of motion and strengthen the ankle and the knee without any reproduction of  her left lateral calf pain. No overlying skin changes and both ankles are 22 cm in circumference Mental Status: No depression, anxiety, nor agitation. Logical though process. Skin: Warm and dry.  Assessment & Plan: Brianna Walton was seen today for leg pain.  Diagnoses and all orders for this visit:  Left lumbar radiculitis -     predniSONE (DELTASONE) 20 MG tablet; Three tabs daily days 1-3, two tabs daily days 4-6, one tab daily days 7-9, half tab daily days 10-13.   Call if no improvement by Thursday.   Return if symptoms worsen or fail to improve.

## 2015-06-23 ENCOUNTER — Ambulatory Visit (INDEPENDENT_AMBULATORY_CARE_PROVIDER_SITE_OTHER): Payer: Federal, State, Local not specified - PPO | Admitting: Family Medicine

## 2015-06-23 ENCOUNTER — Encounter: Payer: Self-pay | Admitting: Family Medicine

## 2015-06-23 DIAGNOSIS — Z0289 Encounter for other administrative examinations: Secondary | ICD-10-CM

## 2015-06-23 NOTE — Progress Notes (Signed)
No show 06/23/2015

## 2015-08-06 ENCOUNTER — Ambulatory Visit (INDEPENDENT_AMBULATORY_CARE_PROVIDER_SITE_OTHER): Payer: Federal, State, Local not specified - PPO | Admitting: Family Medicine

## 2015-08-06 ENCOUNTER — Encounter: Payer: Self-pay | Admitting: Family Medicine

## 2015-08-06 VITALS — BP 122/87 | HR 85 | Wt 206.0 lb

## 2015-08-06 DIAGNOSIS — B9689 Other specified bacterial agents as the cause of diseases classified elsewhere: Secondary | ICD-10-CM

## 2015-08-06 DIAGNOSIS — J329 Chronic sinusitis, unspecified: Secondary | ICD-10-CM

## 2015-08-06 DIAGNOSIS — A499 Bacterial infection, unspecified: Secondary | ICD-10-CM | POA: Diagnosis not present

## 2015-08-06 MED ORDER — DOXYCYCLINE HYCLATE 100 MG PO TABS: 100.0000 mg | ORAL_TABLET | Freq: Two times a day (BID) | ORAL | Status: DC

## 2015-08-06 NOTE — Progress Notes (Signed)
CC: Brianna Walton is a 55 y.o. female is here for Sinusitis   Subjective: HPI:  The past 2 weeks she's been experiencing nasal congestion, facial pressure in the forehead and cheeks and postnasal drip. No benefit from Allegra or Claritin. She felt like she was getting chills last night which prompted today's visit. She is occasionally having a cough but nonproductive. She's losing her voice at the end of the day. Symptoms are worse first thing in the morning. She denies fevers, wheezing, shortness of breath, confusion or any motor or sensory disturbances   Review Of Systems Outlined In HPI  Past Medical History  Diagnosis Date  . Hypertension   . Hyperlipidemia     Past Surgical History  Procedure Laterality Date  . Abdominal hysterectomy    . Cholecystectomy     Family History  Problem Relation Age of Onset  . Diabetes Mother   . Hypertension Mother   . Hypertension Father     Social History   Social History  . Marital Status: Married    Spouse Name: N/A  . Number of Children: N/A  . Years of Education: N/A   Occupational History  . Not on file.   Social History Main Topics  . Smoking status: Never Smoker   . Smokeless tobacco: Not on file  . Alcohol Use: No  . Drug Use: No  . Sexual Activity: Yes   Other Topics Concern  . Not on file   Social History Narrative     Objective: BP 122/87 mmHg  Pulse 85  Wt 206 lb (93.441 kg)  General: Alert and Oriented, No Acute Distress HEENT: Pupils equal, round, reactive to light. Conjunctivae clear.  External ears unremarkable, canals clear with intact TMs with appropriate landmarks.  Middle ear appears open without effusion. Pink inferior turbinates.  Moist mucous membranes, pharynx without inflammation nor lesions.  Neck supple without palpable lymphadenopathy nor abnormal masses. Lungs: Clear to auscultation bilaterally, no wheezing/ronchi/rales.  Comfortable work of breathing. Good air movement. Extremities: No  peripheral edema.  Strong peripheral pulses.  Mental Status: No depression, anxiety, nor agitation. Skin: Warm and dry.  Assessment & Plan: Brianna Walton was seen today for sinusitis.  Diagnoses and all orders for this visit:  Bacterial sinusitis -     doxycycline (VIBRA-TABS) 100 MG tablet; Take 1 tablet (100 mg total) by mouth 2 (two) times daily.  Start doxycycline consider nasal saline washes.Signs and symptoms requring emergent/urgent reevaluation were discussed with the patient.   Return if symptoms worsen or fail to improve.

## 2015-10-08 ENCOUNTER — Other Ambulatory Visit: Payer: Self-pay | Admitting: Family Medicine

## 2015-11-13 ENCOUNTER — Encounter: Payer: Self-pay | Admitting: Emergency Medicine

## 2015-11-13 ENCOUNTER — Emergency Department
Admission: EM | Admit: 2015-11-13 | Discharge: 2015-11-13 | Disposition: A | Discharge status: Home / Self Care | Payer: Federal, State, Local not specified - PPO | Source: Home / Self Care | Attending: Family Medicine | Admitting: Family Medicine

## 2015-11-13 DIAGNOSIS — N3001 Acute cystitis with hematuria: Secondary | ICD-10-CM

## 2015-11-13 HISTORY — DX: Other specified personal risk factors, not elsewhere classified: Z91.89

## 2015-11-13 LAB — POCT URINALYSIS DIP (MANUAL ENTRY)
Bilirubin, UA: NEGATIVE
Glucose, UA: NEGATIVE
Ketones, POC UA: NEGATIVE
LEUKOCYTES UA: NEGATIVE
Nitrite, UA: NEGATIVE
PH UA: 6 (ref 5–8)
Protein Ur, POC: NEGATIVE
SPEC GRAV UA: 1.015 (ref 1.005–1.03)
Urobilinogen, UA: 0.2 (ref 0–1)

## 2015-11-13 MED ORDER — NITROFURANTOIN MONOHYD MACRO 100 MG PO CAPS: 100.0000 mg | ORAL_CAPSULE | Freq: Two times a day (BID) | ORAL | 0 refills | Status: DC

## 2015-11-13 MED ORDER — PHENAZOPYRIDINE HCL 200 MG PO TABS: 200.0000 mg | ORAL_TABLET | Freq: Three times a day (TID) | ORAL | 0 refills | Status: DC

## 2015-11-13 NOTE — Discharge Instructions (Signed)
Increase fluid intake. °If symptoms become significantly worse during the night or over the weekend, proceed to the local emergency room.  °

## 2015-11-13 NOTE — ED Provider Notes (Signed)
[name]    CSN: [id number] Arrival date & time: 11/13/15  1535     History   Chief Complaint Chief Complaint  Patient presents with  . Urinary Frequency    HPI Brianna Walton is a 55 y.o. female.   Patient complains of two day history of dysuria and frequency.  She has now developed low back discomfort.   The history is provided by the patient.  Urinary Frequency  This is a new problem. The current episode started 2 days ago. The problem occurs constantly. The problem has been gradually worsening. Pertinent negatives include no abdominal pain. Nothing aggravates the symptoms. Nothing relieves the symptoms. She has tried nothing for the symptoms.    Past Medical History:  Diagnosis Date  . At high risk for altered glucose metabolism   . Hyperlipidemia   . Hypertension     Patient Active Problem List   Diagnosis Date Noted  . 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 Proteinuria 09/10/2014  . Vitamin D deficiency 09/10/2014  . 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 Microalbuminuria 06/09/2014  . Lumbar radiculitis 08/08/2013  . Obesity, unspecified 05/08/2013  . Pre-diabetes 05/08/2013  . Essential hypertension, benign 05/08/2013  . Dyslipidemia 05/08/2013  . Personal history of colonic polyps 05/07/2013    Past Surgical History:  Procedure Laterality Date  . ABDOMINAL HYSTERECTOMY    . CHOLECYSTECTOMY      OB History    No data available       Home Medications    Prior to Admission medications   Medication Sig Start Date End Date Taking? Authorizing Provider  cloNIDine (CATAPRES - DOSED IN MG/24 HR) 0.2 mg/24hr patch Place 0.2 mg onto the skin once a week.   Yes Historical Provider, MD  amLODipine-benazepril (LOTREL) 10-40 MG capsule Take 1 capsule by mouth daily. NEED FOLLOW UP APPOINTMENT FOR MORE REFILLS 10/08/15   Emeterio Reeve, DO  doxycycline (VIBRA-TABS) 100 MG tablet Take 1 tablet (100 mg total) by  mouth 2 (two) times daily. 08/06/15   Sean Hommel, DO  metFORMIN (GLUCOPHAGE) 1000 MG tablet Take 0.5 tablets (500 mg total) by mouth 2 (two) times daily. 03/23/15   Marcial Pacas, DO  nitrofurantoin, macrocrystal-monohydrate, (MACROBID) 100 MG capsule Take 1 capsule (100 mg total) by mouth 2 (two) times daily. Take with food. 11/13/15   Kandra Nicolas, MD  phenazopyridine (PYRIDIUM) 200 MG tablet Take 1 tablet (200 mg total) by mouth 3 (three) times daily. Take after meals. 11/13/15   Kandra Nicolas, MD  spironolactone (ALDACTONE) 25 MG tablet Take 25 mg by mouth daily.    Historical Provider, MD    Family History Family History  Problem Relation Age of Onset  . Diabetes Mother   . Hypertension Mother   . Hypertension Father     Social History Social History  Substance Use Topics  . Smoking status: Never Smoker  . Smokeless tobacco: Never Used  . Alcohol use No     Allergies   Atenolol; Hctz [hydrochlorothiazide]; Labetalol; Penicillins; and Spironolactone   Review of Systems Review of Systems  Constitutional: Negative for chills, diaphoresis, fatigue and fever.  Gastrointestinal: Negative for abdominal pain.  Genitourinary: Positive for frequency.  All other systems reviewed and are negative.    Physical Exam Triage Vital Signs ED Triage Vitals  Enc Vitals Group     BP 11/13/15 1613 126/80     Pulse Rate 11/13/15 1613 92     Resp 11/13/15 1613  16     Temp 11/13/15 1613 97.9 F (36.6 C)     Temp Source 11/13/15 1613 Oral     SpO2 11/13/15 1613 99 %     Weight 11/13/15 1614 202 lb (91.6 kg)     Height 11/13/15 1614 5\' 4"  (1.626 m)     Head Circumference --      Peak Flow --      Pain Score 11/13/15 1620 2     Pain Loc --      Pain Edu? --      Excl. in [place]? --    No data found.   Updated Vital Signs BP 126/80 (BP Location: Left Arm)   Pulse 92   Temp 97.9 F (36.6 C) (Oral)   Resp 16   Ht 5\' 4"  (1.626 m)   Wt 202 lb (91.6 kg)   SpO2 99%   BMI 34.67  kg/m   Visual Acuity Right Eye Distance:   Left Eye Distance:   Bilateral Distance:    Right Eye Near:   Left Eye Near:    Bilateral Near:     Physical Exam Nursing notes and Vital Signs reviewed. Appearance:  Patient appears stated age, and in no acute distress.    Eyes:  Pupils are equal, round, and reactive to light and accomodation.  Extraocular movement is intact.  Conjunctivae are not inflamed   Pharynx:  Normal; moist mucous membranes  Neck:  Supple.  No adenopathy Lungs:  Clear to auscultation.  Breath sounds are equal.  Moving air well. Heart:  Regular rate and rhythm without murmurs, rubs, or gallops.  Abdomen:  Nontender without masses or hepatosplenomegaly.  Bowel sounds are present.  No CVA or flank tenderness.  Extremities:  No edema.  Skin:  No rash present.     UC Treatments / Results  Labs (all labs ordered are listed, but only abnormal results are displayed) Labs Reviewed  POCT URINALYSIS DIP (MANUAL ENTRY) - Abnormal; Notable for the following:       Result Value   Color, UA light yellow (*)    Blood, UA moderate (*)    All other components within normal limits  URINE CULTURE    EKG  EKG Interpretation None       Radiology No results found.  Procedures Procedures (including critical care time)  Medications Ordered in UC Medications - No data to display   Initial Impression / Assessment and Plan / UC Course  I have reviewed the triage vital signs and the nursing notes.  Pertinent labs & imaging results that were available during my care of the patient were reviewed by me and considered in my medical decision making (see chart for details).  Clinical Course  Urine culture pending. Begin Macrobid 100mg  BID for one week. Rx for Pyridium. Increase fluid intake. If symptoms become significantly worse during the night or over the weekend, proceed to the local emergency room.  Followup with Family Doctor if not improved in one week.       Final Clinical Impressions(s) / UC Diagnoses   Final diagnoses:  Acute cystitis with hematuria    New Prescriptions New Prescriptions   NITROFURANTOIN, MACROCRYSTAL-MONOHYDRATE, (MACROBID) 100 MG CAPSULE    Take 1 capsule (100 mg total) by mouth 2 (two) times daily. Take with food.   PHENAZOPYRIDINE (PYRIDIUM) 200 MG TABLET    Take 1 tablet (200 mg total) by mouth 3 (three) times daily. Take after meals.     Ishmael Holter  Assunta Found, MD 11/22/15 1115

## 2015-11-13 NOTE — ED Triage Notes (Signed)
Reports onset of frequency of urination 2 days ago; now has some low back discomfort.

## 2015-11-15 LAB — URINE CULTURE: ORGANISM ID, BACTERIA: NO GROWTH

## 2015-11-16 ENCOUNTER — Telehealth: Payer: Self-pay | Admitting: *Deleted

## 2015-11-16 NOTE — Telephone Encounter (Signed)
Callback: No answer, left message on mobile VM f/u from visit. UCX negative. Call back as needed.

## 2015-11-18 ENCOUNTER — Encounter: Payer: Self-pay | Admitting: Osteopathic Medicine

## 2015-11-18 DIAGNOSIS — Q142 Congenital malformation of optic disc: Secondary | ICD-10-CM | POA: Insufficient documentation

## 2015-11-30 ENCOUNTER — Ambulatory Visit: Payer: Federal, State, Local not specified - PPO | Admitting: Osteopathic Medicine

## 2015-12-17 ENCOUNTER — Ambulatory Visit: Payer: Federal, State, Local not specified - PPO | Admitting: Osteopathic Medicine

## 2016-01-14 ENCOUNTER — Other Ambulatory Visit: Payer: Self-pay | Admitting: Osteopathic Medicine

## 2016-01-15 ENCOUNTER — Other Ambulatory Visit: Payer: Self-pay

## 2016-01-15 NOTE — Telephone Encounter (Signed)
Patient called requested a refill for Amlodipine-Benazepril ( Lotrel). #[phone] refills was sent electronically to [facility]. I called patient and advised that she needed to schedule an appointment. Patient was transferred to front  Office to schedule appt. Patient verbally understood that no more refills will be approved without an appointment. Rhonda Cunningham,CMA

## 2016-01-21 ENCOUNTER — Ambulatory Visit: Payer: Federal, State, Local not specified - PPO | Admitting: Osteopathic Medicine

## 2016-01-28 ENCOUNTER — Encounter: Payer: Self-pay | Admitting: Osteopathic Medicine

## 2016-01-28 ENCOUNTER — Ambulatory Visit (INDEPENDENT_AMBULATORY_CARE_PROVIDER_SITE_OTHER): Payer: Federal, State, Local not specified - PPO | Admitting: Osteopathic Medicine

## 2016-01-28 VITALS — BP 134/84 | HR 47 | Wt 205.0 lb

## 2016-01-28 DIAGNOSIS — N182 Chronic kidney disease, stage 2 (mild): Secondary | ICD-10-CM

## 2016-01-28 DIAGNOSIS — R809 Proteinuria, unspecified: Secondary | ICD-10-CM

## 2016-01-28 DIAGNOSIS — I10 Essential (primary) hypertension: Secondary | ICD-10-CM | POA: Diagnosis not present

## 2016-01-28 DIAGNOSIS — Z780 Asymptomatic menopausal state: Secondary | ICD-10-CM

## 2016-01-28 DIAGNOSIS — E785 Hyperlipidemia, unspecified: Secondary | ICD-10-CM

## 2016-01-28 DIAGNOSIS — R7303 Prediabetes: Secondary | ICD-10-CM

## 2016-01-28 LAB — POCT UA - MICROALBUMIN

## 2016-01-28 LAB — POCT GLYCOSYLATED HEMOGLOBIN (HGB A1C): HEMOGLOBIN A1C: 6.2

## 2016-01-28 MED ORDER — AMLODIPINE BESY-BENAZEPRIL HCL 10-40 MG PO CAPS: 1.0000 | ORAL_CAPSULE | Freq: Every day | ORAL | 1 refills | Status: DC

## 2016-01-28 MED ORDER — METFORMIN HCL 500 MG PO TABS: 500.0000 mg | ORAL_TABLET | Freq: Two times a day (BID) | ORAL | 3 refills | Status: DC

## 2016-01-28 MED ORDER — CLONIDINE HCL 0.2 MG PO TABS: 0.2000 mg | ORAL_TABLET | Freq: Two times a day (BID) | ORAL | 1 refills | Status: DC

## 2016-01-28 MED ORDER — SPIRONOLACTONE 25 MG PO TABS: 25.0000 mg | ORAL_TABLET | Freq: Every day | ORAL | 1 refills | Status: DC

## 2016-01-28 NOTE — Progress Notes (Signed)
HPI: Brianna Walton is a 57 y.o. female  who presents to [place] today, 01/28/16,  for chief complaint of:  Chief Complaint  Patient presents with  . Hypertension  . prediabetes    Pleasant patient here to transfer care from Dr. Ileene Rubens to myself.  Hypertension: No chest pain, pressure, shortness of breath, no outside blood pressures to report. Medications as below  Prediabetes: Patient admits could be doing a bit better in terms of lifestyle, diet/exercise. Due for A1c recheck.   History of proteinuria and chronic kidney disease stage II: Following with nephrology  Hyperlipidemia: Stable, tolerating statin. Due for recheck.   Past medical, surgical, social and family history reviewed: Patient Active Problem List   Diagnosis Date Noted  . 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 Proteinuria 09/10/2014  . Vitamin D deficiency 09/10/2014  . 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 Microalbuminuria 06/09/2014  . Lumbar radiculitis 08/08/2013  . Obesity, unspecified 05/08/2013  . Pre-diabetes 05/08/2013  . Essential hypertension, benign 05/08/2013  . Dyslipidemia 05/08/2013  . Personal history of colonic polyps 05/07/2013   Past Surgical History:  Procedure Laterality Date  . ABDOMINAL HYSTERECTOMY    . CHOLECYSTECTOMY     Social History  Substance Use Topics  . Smoking status: Never Smoker  . Smokeless tobacco: Never Used  . Alcohol use No   Family History  Problem Relation Age of Onset  . Diabetes Mother   . Hypertension Mother   . Hypertension Father      Current medication list and allergy/intolerance information reviewed:   Current Outpatient Prescriptions on File Prior to Visit  Medication Sig Dispense Refill  . amLODipine-benazepril (LOTREL) 10-40 MG capsule TAKE 1 CAPSULE BY MOUTH DAILY. NEED FOLLOW UP APPOINTMENT FOR MORE REFILLS 30 capsule 0  . cloNIDine  (CATAPRES - DOSED IN MG/24 HR) 0.2 mg/24hr patch Place 0.2 mg onto the skin once a week.    . metFORMIN (GLUCOPHAGE) 1000 MG tablet Take 0.5 tablets (500 mg total) by mouth 2 (two) times daily. 90 tablet 1  . spironolactone (ALDACTONE) 25 MG tablet Take 25 mg by mouth daily.     No current facility-administered medications on file prior to visit.    Allergies  Allergen Reactions  . Atenolol     Achy.   . Hctz [Hydrochlorothiazide]     Achy all over/Gout  . Labetalol   . Penicillins   . Spironolactone     Palpitations/t wave abnormalities.       Review of Systems:  Constitutional: No recent illness  HEENT: No  headache, no vision change  Cardiac: No  chest pain, No  pressure, No palpitations  Respiratory:  No  shortness of breath. No  Cough  Gastrointestinal: No  abdominal pain, no change on bowel habits  Musculoskeletal: No new myalgia/arthralgia  Skin: No  Rash  Hem/Onc: No  easy bruising/bleeding, No  abnormal lumps/bumps  Neurologic: No  weakness, No  Dizziness  Psychiatric: No  concerns with depression, No  concerns with anxiety  Exam:  BP 134/84   Pulse (!) 47   Wt 205 lb (93 kg)   BMI 35.19 kg/m   Constitutional: VS see above. General Appearance: alert, well-developed, well-nourished, NAD  Eyes: Normal lids and conjunctive, non-icteric sclera  Ears, Nose, Mouth, Throat: MMM, Normal external inspection ears/nares/mouth/lips/gums.  Neck: No masses, trachea midline.   Respiratory: Normal respiratory effort. no wheeze, no rhonchi, no rales  Cardiovascular: S1/S2 normal, no murmur, no rub/gallop auscultated. RRR.   Musculoskeletal: Gait normal. Symmetric and independent movement of all extremities  Neurological: Normal balance/coordination. No tremor.  Skin: warm, dry, intact.   Psychiatric: Normal judgment/insight. Normal mood and affect. Oriented x3.   Results for orders placed or performed in visit on 01/28/16 (from the past 72 hour(s))  POCT  HgB A1C     Status: Abnormal   Collection Time: 01/28/16  3:40 PM  Result Value Ref Range   Hemoglobin A1C 6.2   POCT UA - Microalbumin     Status: Abnormal   Collection Time: 01/28/16  3:54 PM  Result Value Ref Range   Microalbumin Ur, POC 80mg /l mg/L   Creatinine, POC 300mg /g mg/dL   Albumin/Creatinine Ratio, Urine, POC 30-300mg /g     Comment: followed by nephrologist      ASSESSMENT/PLAN:   Essential hypertension, benign - Plan: CBC with Differential/Platelet, COMPLETE METABOLIC PANEL WITH GFR, TSH, Lipid panel, cloNIDine (CATAPRES) 0.2 MG tablet, spironolactone (ALDACTONE) 25 MG tablet, amLODipine-benazepril (LOTREL) 10-40 MG capsule  Pre-diabetes - Plan: POCT UA - Microalbumin, metFORMIN (GLUCOPHAGE) 500 MG tablet, POCT HgB A1C, CANCELED: HgB A1c  Proteinuria, unspecified type  Chronic kidney disease, stage II (mild) - Plan: VITAMIN D 25 Hydroxy (Vit-D Deficiency, Fractures)  Dyslipidemia  Postmenopausal - Plan: VITAMIN D 25 Hydroxy (Vit-D Deficiency, Fractures)     Visit summary with medication list and pertinent instructions was printed for patient to review. All questions at time of visit were answered - patient instructed to contact office with any additional concerns. ER/RTC precautions were reviewed with the patient. Follow-up plan: Return in about 6 months (around 07/27/2016) for [place] w/ follow-up A1C and blood pressure.

## 2016-01-28 NOTE — Patient Instructions (Signed)
Preventing Type 2 Diabetes Mellitus Type 2 diabetes (type 2 diabetes mellitus) is a long-term (chronic) disease that affects blood sugar (glucose) levels. Normally, a hormone called insulin allows glucose to enter cells in the body. The cells use glucose for energy. In type 2 diabetes, one or both of these problems may be present:  The body does not make enough insulin.  The body does not respond properly to insulin that it makes (insulin resistance). Insulin resistance or lack of insulin causes excess glucose to build up in the blood instead of going into cells. As a result, high blood glucose (hyperglycemia) develops, which can cause many complications. Being overweight or obese and having an inactive (sedentary) lifestyle can increase your risk for diabetes. Type 2 diabetes can be delayed or prevented by making certain nutrition and lifestyle changes. What nutrition changes can be made?  Eat healthy meals and snacks regularly. Keep a healthy snack with you for when you get hungry between meals, such as fruit or a handful of nuts.  Eat lean meats and proteins that are low in saturated fats, such as chicken, fish, egg whites, and beans. Avoid processed meats.  Eat plenty of fruits and vegetables and plenty of grains that have not been processed (whole grains). It is recommended that you eat:  1?2 cups of fruit every day.  2?3 cups of vegetables every day.  6?8 oz of whole grains every day, such as oats, whole wheat, bulgur, brown rice, quinoa, and millet.  Eat low-fat dairy products, such as milk, yogurt, and cheese.  Eat foods that contain healthy fats, such as nuts, avocado, olive oil, and canola oil.  Drink water throughout the day. Avoid drinks that contain added sugar, such as soda or sweet tea.  Follow instructions from your health care provider about specific eating or drinking restrictions.  Control how much food you eat at a time (portion size).  Check food labels to find  out the serving sizes of foods.  Use a kitchen scale to weigh amounts of foods.  Saute or steam food instead of frying it. Cook with water or broth instead of oils or butter.  Limit your intake of:  Salt (sodium). Have no more than 1 tsp (2,400 mg) of sodium a day. If you have heart disease or high blood pressure, have less than ? tsp (1,500 mg) of sodium a day.  Saturated fat. This is fat that is solid at room temperature, such as butter or fat on meat. What lifestyle changes can be made?  Activity  Do moderate-intensity physical activity for at least 30 minutes on at least 5 days of the week, or as much as told by your health care provider.  Ask your health care provider what activities are safe for you. A mix of physical activities may be best, such as walking, swimming, cycling, and strength training.  Try to add physical activity into your day. For example:  Park in spots that are farther away than usual, so that you walk more. For example, park in a far corner of the parking lot when you go to the office or the grocery store.  Take a walk during your lunch break.  Use stairs instead of elevators or escalators. Weight Loss  Lose weight as directed. Your health care provider can determine how much weight loss is best for you and can help you lose weight safely.  If you are overweight or obese, you may be instructed to lose at least 5?7 % of  your body weight. Alcohol and Tobacco   Limit alcohol intake to no more than 1 drink a day for nonpregnant women and 2 drinks a day for men. One drink equals 12 oz of beer, 5 oz of wine, or 1 oz of hard liquor.  Do not use any tobacco products, such as cigarettes, chewing tobacco, and e-cigarettes. If you need help quitting, ask your health care provider. Work With [facility] Provider  Have your blood glucose tested regularly, as told by your health care provider.  Discuss your risk factors and how you can reduce your risk for  diabetes.  Get screening tests as told by your health care provider. You may have screening tests regularly, especially if you have certain risk factors for type 2 diabetes.  Make an appointment with a diet and nutrition specialist (registered dietitian). A registered dietitian can help you make a healthy eating plan and can help you understand portion sizes and food labels. Why are these changes important?  It is possible to prevent or delay type 2 diabetes and related health problems by making lifestyle and nutrition changes.  It can be difficult to recognize signs of type 2 diabetes. The best way to avoid possible damage to your body is to take actions to prevent the disease before you develop symptoms. What can happen if changes are not made?  Your blood glucose levels may keep increasing. Having high blood glucose for a long time is dangerous. Too much glucose in your blood can damage your blood vessels, heart, kidneys, nerves, and eyes.  You may develop prediabetes or type 2 diabetes. Type 2 diabetes can lead to many chronic health problems and complications, such as:  Heart disease.  Stroke.  Blindness.  Kidney disease.  Depression.  Poor circulation in the feet and legs, which could lead to surgical removal (amputation) in severe cases. Where to find support:  Ask your health care provider to recommend a registered dietitian, diabetes educator, or weight loss program.  Look for local or online weight loss groups.  Join a gym, fitness club, or outdoor activity group, such as a walking club. Where to find more information: To learn more about diabetes and diabetes prevention, visit:  American Diabetes Association (ADA): www.diabetes.[employer] of Diabetes and Digestive and Kidney Diseases: FindSpin.nl To learn more about healthy eating, visit:  The U.S. Department of Agriculture Scientist, research (physical sciences)), Choose My Plate:  http://wiley-williams.com/  Office of Disease Prevention and Health Promotion (ODPHP), Dietary Guidelines: SurferLive.at Summary  You can reduce your risk for type 2 diabetes by increasing your physical activity, eating healthy foods, and losing weight as directed.  Talk with your health care provider about your risk for type 2 diabetes. Ask about any blood tests or screening tests that you need to have. This information is not intended to replace advice given to you by your health care provider. Make sure you discuss any questions you have with your health care provider. Document Released: 04/27/2015 Document Revised: 06/11/2015 Document Reviewed: 02/24/2015 Elsevier Interactive Patient Education  2017 [employer].

## 2016-02-08 LAB — LIPID PANEL
CHOLESTEROL: 185 mg/dL (ref <200)
HDL: 37 mg/dL — ABNORMAL LOW (ref >50)
LDL Cholesterol: 117 mg/dL — ABNORMAL HIGH (ref <100)
Total CHOL/HDL Ratio: 5 Ratio — ABNORMAL HIGH (ref <5.0)
Triglycerides: 153 mg/dL — ABNORMAL HIGH (ref <150)
VLDL: 31 mg/dL — AB (ref <30)

## 2016-02-08 LAB — COMPLETE METABOLIC PANEL WITH GFR
ALBUMIN: 4.1 g/dL (ref 3.6–5.1)
ALT: 17 U/L (ref 6–29)
AST: 19 U/L (ref 10–35)
Alkaline Phosphatase: 97 U/L (ref 33–130)
BILIRUBIN TOTAL: 0.5 mg/dL (ref 0.2–1.2)
BUN: 11 mg/dL (ref 7–25)
CO2: 19 mmol/L — ABNORMAL LOW (ref 20–31)
Calcium: 9.8 mg/dL (ref 8.6–10.4)
Chloride: 105 mmol/L (ref 98–110)
Creat: 1.27 mg/dL — ABNORMAL HIGH (ref 0.50–1.05)
GFR, EST AFRICAN AMERICAN: 55 mL/min — AB (ref >=60)
GFR, EST NON AFRICAN AMERICAN: 47 mL/min — AB (ref >=60)
Glucose, Bld: 116 mg/dL — ABNORMAL HIGH (ref 65–99)
Potassium: 4.2 mmol/L (ref 3.5–5.3)
Sodium: 141 mmol/L (ref 135–146)
TOTAL PROTEIN: 7.5 g/dL (ref 6.1–8.1)

## 2016-02-08 LAB — CBC WITH DIFFERENTIAL/PLATELET
BASOS ABS: 0 {cells}/uL (ref 0–200)
Basophils Relative: 0 %
EOS ABS: 637 {cells}/uL — AB (ref 15–500)
Eosinophils Relative: 7 %
HCT: 40.5 % (ref 35.0–45.0)
HEMOGLOBIN: 13.2 g/dL (ref 11.7–15.5)
LYMPHS ABS: 3094 {cells}/uL (ref 850–3900)
Lymphocytes Relative: 34 %
MCH: 24.4 pg — AB (ref 27.0–33.0)
MCHC: 32.6 g/dL (ref 32.0–36.0)
MCV: 74.7 fL — ABNORMAL LOW (ref 80.0–100.0)
MPV: 10.9 fL (ref 7.5–12.5)
Monocytes Absolute: 819 cells/uL (ref 200–950)
Monocytes Relative: 9 %
NEUTROS ABS: 4550 {cells}/uL (ref 1500–7800)
Neutrophils Relative %: 50 %
Platelets: 416 10*3/uL — ABNORMAL HIGH (ref 140–400)
RBC: 5.42 MIL/uL — ABNORMAL HIGH (ref 3.80–5.10)
RDW: 15.1 % — ABNORMAL HIGH (ref 11.0–15.0)
WBC: 9.1 10*3/uL (ref 3.8–10.8)

## 2016-02-08 LAB — TSH: TSH: 3.24 mIU/L

## 2016-02-09 LAB — VITAMIN D 25 HYDROXY (VIT D DEFICIENCY, FRACTURES): VIT D 25 HYDROXY: 32 ng/mL (ref 30–100)

## 2016-03-31 ENCOUNTER — Encounter: Payer: Self-pay | Admitting: Emergency Medicine

## 2016-03-31 ENCOUNTER — Emergency Department
Admission: EM | Admit: 2016-03-31 | Discharge: 2016-03-31 | Disposition: A | Discharge status: Home / Self Care | Payer: Federal, State, Local not specified - PPO | Source: Home / Self Care | Attending: Emergency Medicine | Admitting: Emergency Medicine

## 2016-03-31 DIAGNOSIS — H6503 Acute serous otitis media, bilateral: Secondary | ICD-10-CM | POA: Diagnosis not present

## 2016-03-31 DIAGNOSIS — J301 Allergic rhinitis due to pollen: Secondary | ICD-10-CM

## 2016-03-31 NOTE — ED Triage Notes (Signed)
Right ear pain x 3 days, dizziness

## 2016-03-31 NOTE — ED Provider Notes (Addendum)
[name]    CSN: [id number] Arrival date & time: 03/31/16  1713     History   Chief Complaint Chief Complaint  Patient presents with  . Otalgia    HPI Brianna Walton is a 56 y.o. female.   HPI 3 days of allergy symptoms flare up of clear rhinorrhea, pressure both ears, worse on the right. She feels her right ear could be clogged up with wax and requests we checked this. Has some vague dizziness 2 days ago that resolved the same day and has not recurred. Denies focal neurologic symptoms currently. Denies fever or chills or discolored rhinorrhea. No nausea or vomiting or chest pain or shortness of breath. She has seasonal allergies. Not taking any medicine currently for this. Past Medical History:  Diagnosis Date  . At high risk for altered glucose metabolism   . Hyperlipidemia   . Hypertension     Patient Active Problem List   Diagnosis Date Noted  . 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 Proteinuria 09/10/2014  . Vitamin D deficiency 09/10/2014  . 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 Microalbuminuria 06/09/2014  . Lumbar radiculitis 08/08/2013  . Obesity, unspecified 05/08/2013  . Essential hypertension, benign 05/08/2013  . Dyslipidemia 05/08/2013  . Personal history of colonic polyps 05/07/2013    Past Surgical History:  Procedure Laterality Date  . ABDOMINAL HYSTERECTOMY    . CHOLECYSTECTOMY      OB History    No data available       Home Medications    Prior to Admission medications   Medication Sig Start Date End Date Taking? Authorizing Provider  amLODipine-benazepril (LOTREL) 10-40 MG capsule Take 1 capsule by mouth daily. 01/28/16   Emeterio Reeve, DO  cloNIDine (CATAPRES) 0.2 MG tablet Take 1 tablet (0.2 mg total) by mouth 2 (two) times daily. 01/28/16   Emeterio Reeve, DO  metFORMIN (GLUCOPHAGE) 500 MG tablet Take 1 tablet (500 mg total) by mouth 2 (two) times  daily with a meal. 01/28/16   Emeterio Reeve, DO  spironolactone (ALDACTONE) 25 MG tablet Take 1 tablet (25 mg total) by mouth daily. 01/28/16   Emeterio Reeve, DO    Family History Family History  Problem Relation Age of Onset  . Diabetes Mother   . Hypertension Mother   . Hypertension Father     Social History Social History  Substance Use Topics  . Smoking status: Never Smoker  . Smokeless tobacco: Never Used  . Alcohol use No     Allergies   Atenolol; Hctz [hydrochlorothiazide]; Labetalol; and Penicillins   Review of Systems Review of Systems  All other systems reviewed and are negative.    Physical Exam Triage Vital Signs ED Triage Vitals  Enc Vitals Group     BP 03/31/16 1731 (!) 169/100     Pulse Rate 03/31/16 1731 (!) 102     Resp --      Temp 03/31/16 1731 98.2 F (36.8 C)     Temp Source 03/31/16 1731 Oral     SpO2 03/31/16 1731 98 %     Weight 03/31/16 1731 203 lb (92.1 kg)     Height 03/31/16 1731 5\' 4"  (1.626 m)     Head Circumference --      Peak Flow --      Pain Score 03/31/16 1739 6     Pain Loc --      Pain Edu? --  Excl. in GC? --    No data found.   Updated Vital Signs BP (!) 169/100 (BP Location: Left Arm)   Pulse (!) 102   Temp 98.2 F (36.8 C) (Oral)   Ht 5\' 4"  (1.626 m)   Wt 203 lb (92.1 kg)   SpO2 98%   BMI 34.84 kg/m   Visual Acuity Right Eye Distance:   Left Eye Distance:   Bilateral Distance:    Right Eye Near:   Left Eye Near:    Bilateral Near:     Physical Exam  Constitutional: She is oriented to person, place, and time. She appears well-developed and well-nourished. No distress.  HENT:  Head: Normocephalic and atraumatic.  Mouth/Throat: Oropharynx is clear and moist.  Ears: Canals normal and patent with scant wax. No discharge. Both TMs with normal landmarks except slight air-fluid levels. Both TMs are pearly gray with good light reflex, no redness.  Nose: Boggy pale turbinates, mild serous  drainage.  Oropharynx: Clear, within normal limits, except mild serous postnasal drainage and mild lymphoid hyperplasia.  Eyes: Pupils are equal, round, and reactive to light. No scleral icterus.  Neck: Normal range of motion. Neck supple.  Cardiovascular: Normal rate, regular rhythm and normal heart sounds.   Pulmonary/Chest: Effort normal.  Abdominal: She exhibits no distension.  Lymphadenopathy:    She has no cervical adenopathy.  Neurological: She is alert and oriented to person, place, and time. No cranial nerve deficit. Coordination normal.  Gait normal without ataxia  Skin: Skin is warm and dry. No rash noted.  Psychiatric: She has a normal mood and affect. Her behavior is normal.  Vitals reviewed.    UC Treatments / Results  Labs (all labs ordered are listed, but only abnormal results are displayed) Labs Reviewed - No data to display  EKG  EKG Interpretation None       Radiology No results found.  Procedures Procedures (including critical care time)  Medications Ordered in UC Medications - No data to display   Initial Impression / Assessment and Plan / UC Course  I have reviewed the triage vital signs and the nursing notes.  Pertinent labs & imaging results that were available during my care of the patient were reviewed by me and considered in my medical decision making (see chart for details).      Final Clinical Impressions(s) / UC Diagnoses   Final diagnoses:  Bilateral acute serous otitis media, recurrence not specified  Acute seasonal allergic rhinitis due to pollen    Explained to her that it does not seem to be an earwax problem and that there is no sign of infection. New Prescriptions-none. Advised to avoid decongestants which can raise BP. I considered the option of short oral prednisone burst, but I feel the risks outweigh the benefits for her at this time. She agrees. Advised OTC plain Zyrtec and Flonase.. Written instructions given. Other  advice given. Explained BP was elevated and she needs follow-up with PCP within 3 days to recheck BP. Red flags discussed and precautions discussed.    Jacqulyn Cane, MD 03/31/16 [id number]    Jacqulyn Cane, MD 03/31/16 [phone]

## 2016-07-07 ENCOUNTER — Ambulatory Visit: Payer: Federal, State, Local not specified - PPO | Admitting: Sports Medicine

## 2016-07-08 ENCOUNTER — Ambulatory Visit (INDEPENDENT_AMBULATORY_CARE_PROVIDER_SITE_OTHER): Payer: Federal, State, Local not specified - PPO | Admitting: Sports Medicine

## 2016-07-08 ENCOUNTER — Ambulatory Visit (INDEPENDENT_AMBULATORY_CARE_PROVIDER_SITE_OTHER): Payer: Federal, State, Local not specified - PPO

## 2016-07-08 ENCOUNTER — Encounter: Payer: Self-pay | Admitting: Sports Medicine

## 2016-07-08 DIAGNOSIS — M542 Cervicalgia: Secondary | ICD-10-CM

## 2016-07-08 DIAGNOSIS — R519 Headache, unspecified: Secondary | ICD-10-CM | POA: Insufficient documentation

## 2016-07-08 DIAGNOSIS — R51 Headache: Secondary | ICD-10-CM

## 2016-07-08 LAB — BASIC METABOLIC PANEL
CO2: 24 mmol/L (ref 20–31)
Chloride: 101 mmol/L (ref 98–110)
Creat: 1.14 mg/dL — ABNORMAL HIGH (ref 0.50–1.05)
Sodium: 137 mmol/L (ref 135–146)

## 2016-07-08 LAB — BASIC METABOLIC PANEL WITH GFR
BUN: 17 mg/dL (ref 7–25)
Calcium: 9.4 mg/dL (ref 8.6–10.4)
Glucose, Bld: 97 mg/dL (ref 65–99)
Potassium: 4.1 mmol/L (ref 3.5–5.3)

## 2016-07-08 MED ORDER — PREDNISONE 50 MG PO TABS: ORAL_TABLET | ORAL | 0 refills | Status: DC

## 2016-07-08 MED ORDER — MELOXICAM 15 MG PO TABS: ORAL_TABLET | ORAL | 3 refills | Status: DC

## 2016-07-08 NOTE — Assessment & Plan Note (Signed)
Unclear etiology, but considering her neck pain shooting to her head, as well as evidence of papilledema on funduscopy exam we are going to proceed with brain MRI with and without IV contrast. Checking a BMP as well.

## 2016-07-08 NOTE — Progress Notes (Signed)
   Subjective:    I'm seeing this patient as a consultation for:  Dr. Emeterio Reeve  CC: Neck pain  HPI: For the past several weeks this pleasant 56 year old female has had pain that she localizes in the base of her left neck, radiating up to the top of her head, it's an intermittent grabbing type sensation, with stiffness, denies anything radicular down the arms, no constitutional symptoms.  Headache: Present for some time now, intermittent, she did see her optometrist who told her that she had some degree of papilledema. Has not yet had advanced imaging.  Past medical history:  Negative.  See flowsheet/record as well for more information.  Surgical history: Negative.  See flowsheet/record as well for more information.  Family history: Negative.  See flowsheet/record as well for more information.  Social history: Negative.  See flowsheet/record as well for more information.  Allergies, and medications have been entered into the medical record, reviewed, and no changes needed.   Review of Systems: No headache, visual changes, nausea, vomiting, diarrhea, constipation, dizziness, abdominal pain, skin rash, fevers, chills, night sweats, weight loss, swollen lymph nodes, body aches, joint swelling, muscle aches, chest pain, shortness of breath, mood changes, visual or auditory hallucinations.   Objective:   General: Well Developed, well nourished, and in no acute distress.  Neuro/Psych: Alert and oriented x3, extra-ocular muscles intact, able to move all 4 extremities, sensation grossly intact.Cranial nerves II through XII are intact, motor, sensory, coordinative functions are all intact. Skin: Warm and dry, no rashes noted.  Respiratory: Not using accessory muscles, speaking in full sentences, trachea midline.  Cardiovascular: Pulses palpable, no extremity edema. Abdomen: Does not appear distended. Neck: Negative spurling's Full neck range of motion Grip strength and sensation normal  in bilateral hands Strength good C4 to T1 distribution No sensory change to C4 to T1 Reflexes normal  Impression and Recommendations:   This case required medical decision making of moderate complexity.  Neck pain on left side Suspect cervical spasm. Physical therapy, x-rays, prednisone, meloxicam. Return in one month, MRI if no better.  Headache Unclear etiology, but considering her neck pain shooting to her head, as well as evidence of papilledema on funduscopy exam we are going to proceed with brain MRI with and without IV contrast. Checking a BMP as well.

## 2016-07-08 NOTE — Assessment & Plan Note (Signed)
Suspect cervical spasm. Physical therapy, x-rays, prednisone, meloxicam. Return in one month, MRI if no better.

## 2016-07-13 ENCOUNTER — Telehealth: Payer: Self-pay | Admitting: Sports Medicine

## 2016-07-13 MED ORDER — DIAZEPAM 5 MG PO TABS: ORAL_TABLET | ORAL | 0 refills | Status: DC

## 2016-07-13 NOTE — Telephone Encounter (Signed)
Rx in box. 

## 2016-07-13 NOTE — Telephone Encounter (Signed)
Left VM advising Pt.  

## 2016-07-13 NOTE — Telephone Encounter (Signed)
Pt requesting pre meds prior to MRI.

## 2016-07-18 ENCOUNTER — Other Ambulatory Visit: Payer: Federal, State, Local not specified - PPO

## 2016-07-25 ENCOUNTER — Ambulatory Visit: Payer: Federal, State, Local not specified - PPO | Admitting: Osteopathic Medicine

## 2016-07-29 ENCOUNTER — Ambulatory Visit (INDEPENDENT_AMBULATORY_CARE_PROVIDER_SITE_OTHER): Payer: Federal, State, Local not specified - PPO | Admitting: Rehabilitative and Restorative Service Providers"

## 2016-07-29 ENCOUNTER — Encounter: Payer: Self-pay | Admitting: Rehabilitative and Restorative Service Providers"

## 2016-07-29 DIAGNOSIS — R29898 Other symptoms and signs involving the musculoskeletal system: Secondary | ICD-10-CM

## 2016-07-29 DIAGNOSIS — G4489 Other headache syndrome: Secondary | ICD-10-CM | POA: Diagnosis not present

## 2016-07-29 DIAGNOSIS — M542 Cervicalgia: Secondary | ICD-10-CM

## 2016-07-29 DIAGNOSIS — R293 Abnormal posture: Secondary | ICD-10-CM

## 2016-07-29 NOTE — Therapy (Signed)
[place], [place], [address] Phone: [phone]   Fax:  [phone]  Physical Therapy Evaluation  Patient Details  Name: Brianna Walton MRN: [id number] Date of Birth: 1960-11-28 Referring Provider: Dr Dianah Field  Encounter Date: 07/29/2016      PT End of Session - 07/29/16 1020    Visit Number 1   Number of Visits 12   Date for PT Re-Evaluation 09/09/16   PT Start Time 9833   PT Stop Time 1112   PT Time Calculation (min) 57 min   Activity Tolerance Patient tolerated treatment well      Past Medical History:  Diagnosis Date  . At high risk for altered glucose metabolism   . Hyperlipidemia   . Hypertension     Past Surgical History:  Procedure Laterality Date  . ABDOMINAL HYSTERECTOMY    . CHOLECYSTECTOMY      There were no vitals filed for this visit.       Subjective Assessment - 07/29/16 1023    Subjective Patient reports that she has had spasms in her neck for the past month. She has a neck spasm and then the pain with go into her head - top of her head and she ends up with a headache. Symtpoms are with turning to the Lt and can occur in sitting or standing. Symtpoms don't occur daily but she has them weekly.    Pertinent History LBP and stiffness; sometimes Rt/Lt shoulder pain    Diagnostic tests xrays of cervical spine    Patient Stated Goals neck to stop spasm    Currently in Pain? No/denies   Pain Location Neck   Pain Orientation Left   Pain Descriptors / Indicators Spasm   Pain Type Acute pain   Pain Onset More than a month ago   Pain Frequency Intermittent   Aggravating Factors  turning head to the Lt in an awkward position    Pain Relieving Factors grabbing neck            [facility] PT Assessment - 07/29/16 0001      Assessment   Medical Diagnosis Lt cervical pain with HA    Referring Provider Dr Dianah Field   Onset Date/Surgical Date 06/17/16   Hand Dominance  Right   Next MD Visit 08/05/16   Prior Therapy none     Precautions   Precautions None     Balance Screen   Has the patient fallen in the past 6 months No   Has the patient had a decrease in activity level because of a fear of falling?  No   Is the patient reluctant to leave their home because of a fear of falling?  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    Leisure household chores; walking two days a week for ~ 30 min      Observation/Other Assessments   Focus on Therapeutic Outcomes (FOTO)  42% limitation      Sensation   Additional Comments WFL's UE's per pt report      Posture/Postural Control   Posture Comments head forward; shoudlers rounded and elevated; head of the humerus anterior in orientation; scapulae abducted and rotated along the thoracic wall     AROM   Overall AROM  Comments end range tightness bilat UE's in elevation    AROM Assessment Site --  cervical ROM assessed in sitting    Cervical Flexion 45   Cervical Extension 13   Cervical - Right Side Bend 32   Cervical - Left Side Bend 27   Cervical - Right Rotation 47   Cervical - Left Rotation 48     Strength   Overall Strength Comments 5/5 bilat UE's except middle and lower traps 4/5 to 4+/5 bilat      Palpation   Spinal mobility hypomobile cervical and thoracic CPA mobs    Palpation comment significant tightness Lt lateral and anterior cerivcla musculature; tightness bilat ant/lat/posterior cervical musculature; upper trap; leveator; pecs             Objective measurements completed on examination: See above findings.          [facility] Adult PT Treatment/Exercise - 07/29/16 0001      Therapeutic Activites    Therapeutic Activities --  instructed in self massage for Lt SCM-supine head supported      Neuro Re-ed     Neuro Re-ed Details  workiing on posture and alignment attempting to facilitate posterior shoudler girdle contraction; engaging middle and lower traps requiring repeated verbal and tactile cues      Neck Exercises: Standing   Neck Retraction 5 reps;10 secs  axial extension w/ noodle facilitating posterior shd girdle     Shoulder Exercises: Supine   Other Supine Exercises supine snow angel 2 min arms at ~ 60 deg      Shoulder Exercises: Standing   Other Standing Exercises scap squeeze 10 sec x 10 with noodle facilitation posterior shoudler girdle musculature      Shoulder Exercises: Stretch   Other Shoulder Stretches 3 way doorway stretch     Moist Heat Therapy   Number Minutes Moist Heat 15 Minutes   Moist Heat Location Cervical;Shoulder  Lt     Electrical Stimulation   Electrical Stimulation Location Lt lateral cervical Lt shoudler    Electrical Stimulation Action IFC   Electrical Stimulation Parameters to tolerance   Electrical Stimulation Goals Tone;Pain     Manual Therapy   Manual therapy comments pt supine   Joint Mobilization cervical mobs    Soft tissue mobilization deep tissue work focus through Lt SCM; lateral cervical musculature as well as anterior and posterior cervical - distraction and depression of bilat scapulae inferiorly and posteriorly for sustained passive stretch    Myofascial Release lateral cervical    Manual Traction manual traction end of manual work                 PT Education - 07/29/16 1103    Education provided Yes   Education Details HEP TENS info   Person(s) Educated Patient   Methods Explanation;Demonstration;Tactile cues;Verbal cues;Handout   Comprehension Verbalized understanding;Returned demonstration;Verbal cues required;Tactile cues required             PT Long Term Goals - 07/29/16 1211      PT LONG TERM GOAL #1   Title Improve posture and alignment with patient to demonstrate increased upright position engaging  posterior shoulder girdle 09/09/16   Time 6   Period Weeks   Status New     PT LONG TERM GOAL #2   Title Increase cervical ROM in lateral flexion and extension by 5-10 degrees with minimal to no pain 09/09/16   Time 6   Period Weeks   Status New  PT LONG TERM GOAL #3   Title Patient reports no neck spasms or headaches 09/09/16   Time 6   Period Weeks   Status New     PT LONG TERM GOAL #4   Title Independent in HEP 09/09/16   Time 6   Period Weeks   Status New     PT LONG TERM GOAL #5   Title Improve FOTO to </= 27% limitation 09/09/16   Time 6   Period Weeks   Status New                Plan - 07/29/16 1206    Clinical Impression Statement Brianna Walton presents with ~ one month history of intermittent Lt cervical spasms producing headaches. She does not recall any injury or accident. Patient presents with poor posture and alignment; limited and painful cervical ROM: poor posterior shoudler girdle strength; pain and decreased functional ability related to cervical dysfunction. Patient has significant tightness through Lt SCM and ant/lat/posterior cervical musculature. She will benefit from PT to address problems identified.    Clinical Presentation Stable   Clinical Decision Making Low   Rehab Potential Good   PT Frequency 2x / week   PT Duration 6 weeks   PT Treatment/Interventions Patient/family education;ADLs/Self Care Home Management;Cryotherapy;Electrical Stimulation;Iontophoresis 4mg /ml Dexamethasone;Moist Heat;Ultrasound;Dry needling;Manual techniques;Therapeutic activities;Therapeutic exercise;Neuromuscular re-education   PT Next Visit Plan review postural correction and HEP; continue work on posture and alignment engaging posterior shoudler girdle; progress with strengthening as tolerated; manual work Lt > Rt cervical musculature; modalities as indicated    Consulted and Agree with Plan of Care Patient      Patient will benefit from skilled therapeutic intervention  in order to improve the following deficits and impairments:  Postural dysfunction, Improper body mechanics, Pain, Increased fascial restricitons, Increased muscle spasms, Decreased strength, Decreased mobility, Decreased range of motion, Decreased activity tolerance  Visit Diagnosis: Cervicalgia - Plan: PT plan of care cert/re-cert  Other headache syndrome - Plan: PT plan of care cert/re-cert  Other symptoms and signs involving the musculoskeletal system - Plan: PT plan of care cert/re-cert  Abnormal posture - Plan: PT plan of care cert/re-cert     Problem List Patient Active Problem List   Diagnosis Date Noted  . Neck pain on left side 07/08/2016  . Headache 07/08/2016  . 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 Proteinuria 09/10/2014  . Vitamin D deficiency 09/10/2014  . 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 Microalbuminuria 06/09/2014  . Lumbar radiculitis 08/08/2013  . Obesity, unspecified 05/08/2013  . Essential hypertension, benign 05/08/2013  . Dyslipidemia 05/08/2013  . Personal history of colonic polyps 05/07/2013    Brianna Walton Nilda Simmer PT, MPH  07/29/2016, 12:17 PM  [facility] [place], [place], [address] Phone: [phone]   Fax:  [phone]  Name: Brianna Walton MRN: [id number] Date of Birth: October 06, 1960

## 2016-07-29 NOTE — Patient Instructions (Addendum)
Axial Extension (Chin Tuck)    Pull chin in and lengthen back of neck. Hold _10___ seconds while counting out loud. Repeat __5__ times. Do _several ___ sessions per day.   Shoulder Blade Squeeze   Can use swim noodle to assist with posture  Rotate shoulders back, then squeeze shoulder blades down and back  Hold 10 sec Repeat _10___ times. Do __several __ sessions per day.  Scapula Adduction With Pectoralis Stretch: Low - Standing   Shoulders at 45 hands even with shoulders, keeping weight through legs, shift weight forward until you feel pull or stretch through the front of your chest. Hold _30__ seconds. Do _3__ times, _2-4__ times per day.   Scapula Adduction With Pectoralis Stretch: Mid-Range - Standing   Shoulders at 90 elbows even with shoulders, keeping weight through legs, shift weight forward until you feel pull or strength through the front of your chest. Hold __30_ seconds. Do _3__ times, __2-4_ times per day.   Scapula Adduction With Pectoralis Stretch: High - Standing   Shoulders at 120 hands up high on the doorway, keeping weight on feet, shift weight forward until you feel pull or stretch through the front of your chest. Hold _30__ seconds. Do _3__ times, _2-3__ times per day.   SUPINE Tips A    Being in the supine position means to be lying on the back. Lying on the back is the position of least compression on the bones and discs of the spine, and helps to re-align the natural curves of the back. Hold 2-5 min bend elbows to release stretch as needed then straighten elbows back out     TENS UNIT: This is helpful for muscle pain and spasm.   Search and Purchase a TENS 7000 2nd edition at www.tenspros.com. It should be less than $30.     TENS unit instructions: Do not shower or bathe with the unit on Turn the unit off before removing electrodes or batteries If the electrodes lose stickiness add a drop of water to the electrodes after they are  disconnected from the unit and place on plastic sheet. If you continued to have difficulty, call the TENS unit company to purchase more electrodes. Do not apply lotion on the skin area prior to use. Make sure the skin is clean and dry as this will help prolong the life of the electrodes. After use, always check skin for unusual red areas, rash or other skin difficulties. If there are any skin problems, does not apply electrodes to the same area. Never remove the electrodes from the unit by pulling the wires. Do not use the TENS unit or electrodes other than as directed. Do not change electrode placement without consultating your therapist or physician. Keep 2 fingers with between each electrode.

## 2016-08-03 ENCOUNTER — Other Ambulatory Visit: Payer: Self-pay | Admitting: Osteopathic Medicine

## 2016-08-03 DIAGNOSIS — I10 Essential (primary) hypertension: Secondary | ICD-10-CM

## 2016-08-05 ENCOUNTER — Ambulatory Visit (INDEPENDENT_AMBULATORY_CARE_PROVIDER_SITE_OTHER): Payer: Federal, State, Local not specified - PPO | Admitting: Sports Medicine

## 2016-08-05 ENCOUNTER — Encounter: Payer: Self-pay | Admitting: Sports Medicine

## 2016-08-05 ENCOUNTER — Ambulatory Visit (INDEPENDENT_AMBULATORY_CARE_PROVIDER_SITE_OTHER): Payer: Federal, State, Local not specified - PPO | Admitting: Physical Therapy

## 2016-08-05 DIAGNOSIS — R29898 Other symptoms and signs involving the musculoskeletal system: Secondary | ICD-10-CM

## 2016-08-05 DIAGNOSIS — M542 Cervicalgia: Secondary | ICD-10-CM

## 2016-08-05 DIAGNOSIS — G4489 Other headache syndrome: Secondary | ICD-10-CM

## 2016-08-05 DIAGNOSIS — Q142 Congenital malformation of optic disc: Secondary | ICD-10-CM

## 2016-08-05 NOTE — Progress Notes (Signed)
  Subjective:    CC: follow-up  HPI: Optic disc abnormalities: Subsequent visit with ophthalmology showed no evidence of papilledema, MRI no longer needed.  Neck pain: Resolved with physical therapy.  Past medical history:  Negative.  See flowsheet/record as well for more information.  Surgical history: Negative.  See flowsheet/record as well for more information.  Family history: Negative.  See flowsheet/record as well for more information.  Social history: Negative.  See flowsheet/record as well for more information.  Allergies, and medications have been entered into the medical record, reviewed, and no changes needed.   Review of Systems: No fevers, chills, night sweats, weight loss, chest pain, or shortness of breath.   Objective:    General: Well Developed, well nourished, and in no acute distress.  Neuro: Alert and oriented x3, extra-ocular muscles intact, sensation grossly intact.  HEENT: Normocephalic, atraumatic, pupils equal round reactive to light, neck supple, no masses, no lymphadenopathy, thyroid nonpalpable.  Skin: Warm and dry, no rashes. Cardiac: Regular rate and rhythm, no murmurs rubs or gallops, no lower extremity edema.  Respiratory: Clear to auscultation bilaterally. Not using accessory muscles, speaking in full sentences. Neck: Negative spurling's Full neck range of motion Grip strength and sensation normal in bilateral hands Strength good C4 to T1 distribution No sensory change to C4 to T1 Reflexes normal  Impression and Recommendations:    Optic disc anomaly Subsequent visit with ophthalmology revealed no evidence of papilledema so brain MRI is not needed.  Neck pain on left side Resolved with conservative measures and physical therapy.  I spent 25 minutes with this patient, greater than 50% was face-to-face time counseling regarding the above diagnoses

## 2016-08-05 NOTE — Therapy (Addendum)
[place], [place], [address] Phone: [phone]   Fax:  [phone]  Physical Therapy Treatment  Patient Details  Name: Brianna Walton MRN: [id number] Date of Birth: Jan 10, 1961 Referring Provider: Dr. Dianah Walton  Encounter Date: 08/05/2016      PT End of Session - 08/05/16 0811    Visit Number 2   Number of Visits 12   Date for PT Re-Evaluation 09/09/16   PT Start Time 0803   PT Stop Time 0843   PT Time Calculation (min) 40 min   Activity Tolerance Patient tolerated treatment well   Behavior During Therapy [facility] for tasks assessed/performed      Past Medical History:  Diagnosis Date  . At high risk for altered glucose metabolism   . Hyperlipidemia   . Hypertension     Past Surgical History:  Procedure Laterality Date  . ABDOMINAL HYSTERECTOMY    . CHOLECYSTECTOMY      There were no vitals filed for this visit.      Subjective Assessment - 08/05/16 0803    Subjective Pt reports the pain in her Lt neck slowly resolved since last session.  She can now turn her head without pain/discomfort; just stiff in ant shoulders.    Pertinent History LBP and stiffness; sometimes Rt/Lt shoulder pain    Currently in Pain? No/denies   Aggravating Factors  turning head to Lt in awkward position    Pain Relieving Factors relaxing            [facility] PT Assessment - 08/05/16 0001      Assessment   Medical Diagnosis Lt cervical pain with HA    Referring Provider Dr. Dianah Walton   Onset Date/Surgical Date 06/17/16   Hand Dominance Right   Next MD Visit 08/05/16     AROM   Cervical Flexion 50   Cervical Extension 22   Cervical - Right Side Bend 30   Cervical - Left Side Bend 35   Cervical - Right Rotation 70   Cervical - Left Rotation 72                     [facility] Adult PT Treatment/Exercise - 08/05/16 0001      Neck Exercises: Standing   Neck Retraction 5 reps;10 secs  axial  extension w/ noodle facilitating posterior shd girdle   Other Standing Exercises scap squeeze around pool noodle x 10 sec x 10 reps.       Shoulder Exercises: Stretch   Other Shoulder Stretches 3 way doorway stretch x 15 sec x 2 reps each position   Other Shoulder Stretches thoracic ext over back of chair x 3 reps;   supine on pool noodle - prolonged horiz abd stretch, then 8 reps of snow angels.      Modalities   Modalities --  declined     Manual Therapy   Manual therapy comments pt supine   Soft tissue mobilization deep tissue and STM to bilat post cervical musculature, upper/mid trap, levator, Lt SCM       Neck Exercises: Stretches   Upper Trapezius Stretch 3 reps;20 seconds   Levator Stretch 2 reps;20 seconds                PT Education - 08/05/16 0833    Education provided Yes   Education Details HEP   Person(s) Educated Patient   Methods Explanation;Handout   Comprehension Verbalized understanding;Returned demonstration  PT Long Term Goals - 08/05/16 0834      PT LONG TERM GOAL #1   Title Improve posture and alignment with patient to demonstrate increased upright position engaging posterior shoulder girdle 09/09/16   Time 6   Period Weeks   Status On-going     PT LONG TERM GOAL #2   Title Increase cervical ROM in lateral flexion and extension by 5-10 degrees with minimal to no pain 09/09/16   Time 6   Period Weeks   Status Achieved     PT LONG TERM GOAL #3   Title Patient reports no neck spasms or headaches 09/09/16   Time 6   Period Weeks   Status Achieved     PT LONG TERM GOAL #4   Title Independent in HEP 09/09/16   Time 6   Period Weeks   Status On-going     PT LONG TERM GOAL #5   Title Improve FOTO to </= 27% limitation 09/09/16   Time 6   Period Weeks   Status On-going               Plan - 08/05/16 0844    Clinical Impression Statement Pt no longer has headaches and is not experiencing "spasm" in neck.  She  continues with some tightness in Lt upper trap/SCM, though it is no longer painful.  Her cervical ROM has improved significantly.  Pt has met LTG #2 and 3 and is making progress towards remaining goals.  Pt requests to hold therapy for next 2 wks while she cont HEP.    Rehab Potential Good   PT Frequency 2x / week   PT Duration 6 weeks   PT Treatment/Interventions Patient/family education;ADLs/Self Care Home Management;Cryotherapy;Electrical Stimulation;Iontophoresis 55m/ml Dexamethasone;Moist Heat;Ultrasound;Dry needling;Manual techniques;Therapeutic activities;Therapeutic exercise;Neuromuscular re-education   PT Next Visit Plan Will hold PT per pt request.  If pt returns will cont posture strengthening.     Consulted and Agree with Plan of Care Patient      Patient will benefit from skilled therapeutic intervention in order to improve the following deficits and impairments:  Postural dysfunction, Improper body mechanics, Pain, Increased fascial restricitons, Increased muscle spasms, Decreased strength, Decreased mobility, Decreased range of motion, Decreased activity tolerance  Visit Diagnosis: Cervicalgia  Other headache syndrome  Other symptoms and signs involving the musculoskeletal system     Problem List Patient Active Problem List   Diagnosis Date Noted  . Neck pain on left side 07/08/2016  . Headache 07/08/2016  . 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 Proteinuria 09/10/2014  . Vitamin D deficiency 09/10/2014  . 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 Microalbuminuria 06/09/2014  . Lumbar radiculitis 08/08/2013  . Obesity, unspecified 05/08/2013  . Essential hypertension, benign 05/08/2013  . Dyslipidemia 05/08/2013  . Personal history of colonic polyps 05/07/2013   JKerin Perna PTA 08/05/16 8:48 AM  [facility] [address] [address]Phone: [phone]  Fax:  [phone] Name: Brianna ButteryMRN: [id number] of Birth: 8Jun 21, 1962 PHYSICAL THERAPY DISCHARGE SUMMARY  Visits from Start of Care: 2  Current functional level related to goals / functional outcomes: See last progress note for discharge status   Remaining deficits: Unknown    Education / Equipment: HEP Plan: Patient agrees to discharge.  Patient goals were partially met. Patient is being discharged due to being pleased with the current functional level.  ?????  Celyn P. Helene Kelp PT, MPH 10/07/16 2:26 PM

## 2016-08-05 NOTE — Assessment & Plan Note (Signed)
Subsequent visit with ophthalmology revealed no evidence of papilledema so brain MRI is not needed.

## 2016-08-05 NOTE — Assessment & Plan Note (Signed)
Resolved with conservative measures and physical therapy.

## 2016-08-05 NOTE — Patient Instructions (Signed)
Side Bend, Sitting    Sit, head in comfortable, centered position, chin slightly tucked. Gently tilt head, bringing ear toward same-side shoulder. Hold _15__ seconds.  Repeat _2__ times per session. Do _3__ sessions per day.  Levator Scapula Stretch, Sitting    Sit, one hand tucked under hip on side to be stretched, other hand over top of head. Turn head toward other side and look down. Use hand on head to gently stretch neck in that position. Hold _15-30__ seconds. Repeat __2_ times per session. Do _2-3__ sessions per day.  Angels in the [place]: Double Arm    Arms near sides, palms up. Press both arms lightly into floor, slide arms out to side and up alongside head. Keep contact with floor throughout motion. At maximal position, lengthen arms. Hold _5-10__ seconds. Relax. Slide arms back to start. Repeat _5-10__ times.  [facility] [place], [place] [address]  [phone] (office) [phone] (fax)

## 2016-08-12 ENCOUNTER — Telehealth: Payer: Self-pay | Admitting: Osteopathic Medicine

## 2016-08-12 NOTE — Telephone Encounter (Signed)
I called patient to schedule her for her annual physical but had to leave a voicemail. - CF

## 2016-08-17 ENCOUNTER — Other Ambulatory Visit: Payer: Self-pay | Admitting: Osteopathic Medicine

## 2016-08-17 DIAGNOSIS — I10 Essential (primary) hypertension: Secondary | ICD-10-CM

## 2016-08-24 ENCOUNTER — Ambulatory Visit: Payer: Federal, State, Local not specified - PPO | Admitting: Osteopathic Medicine

## 2016-09-02 ENCOUNTER — Other Ambulatory Visit: Payer: Self-pay | Admitting: Osteopathic Medicine

## 2016-09-02 DIAGNOSIS — I10 Essential (primary) hypertension: Secondary | ICD-10-CM

## 2016-09-08 ENCOUNTER — Ambulatory Visit (INDEPENDENT_AMBULATORY_CARE_PROVIDER_SITE_OTHER): Payer: Federal, State, Local not specified - PPO | Admitting: Osteopathic Medicine

## 2016-09-08 ENCOUNTER — Encounter: Payer: Self-pay | Admitting: Osteopathic Medicine

## 2016-09-08 VITALS — BP 122/81 | HR 88 | Ht 64.0 in | Wt 205.0 lb

## 2016-09-08 DIAGNOSIS — Z1231 Encounter for screening mammogram for malignant neoplasm of breast: Secondary | ICD-10-CM

## 2016-09-08 DIAGNOSIS — R809 Proteinuria, unspecified: Secondary | ICD-10-CM

## 2016-09-08 DIAGNOSIS — N182 Chronic kidney disease, stage 2 (mild): Secondary | ICD-10-CM

## 2016-09-08 DIAGNOSIS — I10 Essential (primary) hypertension: Secondary | ICD-10-CM

## 2016-09-08 DIAGNOSIS — R7303 Prediabetes: Secondary | ICD-10-CM | POA: Diagnosis not present

## 2016-09-08 DIAGNOSIS — Z1239 Encounter for other screening for malignant neoplasm of breast: Secondary | ICD-10-CM

## 2016-09-08 DIAGNOSIS — Z1211 Encounter for screening for malignant neoplasm of colon: Secondary | ICD-10-CM | POA: Diagnosis not present

## 2016-09-08 DIAGNOSIS — E785 Hyperlipidemia, unspecified: Secondary | ICD-10-CM

## 2016-09-08 LAB — POCT GLYCOSYLATED HEMOGLOBIN (HGB A1C): HEMOGLOBIN A1C: 6.3

## 2016-09-08 NOTE — Progress Notes (Signed)
HPI: Brianna Walton is a 56 y.o. female  who presents to [facility] today, 09/08/16,  for chief complaint of:  Chief Complaint  Patient presents with  . Follow-up    PREDIABETES    Pre-DM: Doing fairly well with diet, not much in the way of formal exercise. A1C is stable.  Hypertension: Doing well on current medications. History of intolerance to beta blockers and thiazide diuretics but doing well on amlodipine-benazapril, clonidine, spironolactone.  CKD: Mild persistent elevation in creatinine, history of proteinuria. Recent labs reviewed, creatinine actually looks a little bit better than it has  Hyperlipidemia: Low HDL, mildly elevated LDL. ASCVD risk today calculated at 10 years 6.1%. Moderate statin could be considered but patient would like to hold off on additional medications at this point  Screening measures: Due for colon cancer screening, breast cancer screening. Status post hysterectomy   Past medical history, surgical history, social history and family history reviewed.  Patient Active Problem List   Diagnosis Date Noted  . Neck pain on left side 07/08/2016  . Headache 07/08/2016  . 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 Proteinuria 09/10/2014  . Vitamin D deficiency 09/10/2014  . 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 Microalbuminuria 06/09/2014  . Lumbar radiculitis 08/08/2013  . Obesity, unspecified 05/08/2013  . Essential hypertension, benign 05/08/2013  . Dyslipidemia 05/08/2013  . Personal history of colonic polyps 05/07/2013    Current medication list and allergy/intolerance information reviewed.   Current Outpatient Prescriptions on File Prior to Visit  Medication Sig Dispense Refill  . amLODipine-benazepril (LOTREL) 10-40 MG capsule Take 1 capsule by mouth daily. Please schedule an appt 30 capsule 0  . cloNIDine (CATAPRES) 0.2 MG tablet TAKE 1 TABLET BY  MOUTH TWICE A DAY 60 tablet 0  . metFORMIN (GLUCOPHAGE) 500 MG tablet Take 1 tablet (500 mg total) by mouth 2 (two) times daily with a meal. 180 tablet 3  . spironolactone (ALDACTONE) 25 MG tablet Take 1 tablet (25 mg total) by mouth daily. 90 tablet 1   No current facility-administered medications on file prior to visit.    Allergies  Allergen Reactions  . Atenolol     Achy.   . Hctz [Hydrochlorothiazide]     Achy all over/Gout  . Labetalol   . Penicillins       Review of Systems:  Constitutional: No recent illness  HEENT: No  headache, no vision change  Cardiac: No  chest pain, No  pressure, No palpitations  Respiratory:  No  shortness of breath.  Musculoskeletal: No new myalgia/arthralgia  Skin: No  Rash  Neurologic: No  weakness, No  Dizziness   Exam:  BP 122/81   Pulse 88   Ht 5\' 4"  (6.629 m)   Wt 205 lb (93 kg)   BMI 35.19 kg/m   Constitutional: VS see above. General Appearance: alert, well-developed, well-nourished, NAD  Eyes: Normal lids and conjunctive, non-icteric sclera  Ears, Nose, Mouth, Throat: MMM, Normal external inspection ears/nares/mouth/lips/gums.  Neck: No masses, trachea midline.   Respiratory: Normal respiratory effort. no wheeze, no rhonchi, no rales  Cardiovascular: S1/S2 normal, no murmur, no rub/gallop auscultated. RRR.   Musculoskeletal: Gait normal. Symmetric and independent movement of all extremities  Neurological: Normal balance/coordination. No tremor.  Skin: warm, dry, intact.   Psychiatric: Normal judgment/insight. Normal mood and affect. Oriented x3.    Results for orders placed or performed in visit on 09/08/16 (from the past  24 hour(s))  POCT HgB A1C     Status: None   Collection Time: 09/08/16  9:17 AM  Result Value Ref Range   Hemoglobin A1C 6.3       ASSESSMENT/PLAN: Chronic medical conditions stable. Time spent today discussing overall cardiovascular risk given her personal factors, healthy lifestyle  modifications for diet/exercise to improve cardiovascular health, reduce risk of progression to diabetes.   Prediabetes - Plan: POCT HgB A1C, TSH, Hemoglobin A1c  Breast cancer screening - Plan: MM DIGITAL SCREENING BILATERAL  Colon cancer screening - Plan: Cologuard  Essential hypertension - Plan: CBC, COMPLETE METABOLIC PANEL WITH GFR, Lipid panel, TSH  Chronic kidney disease, stage II (mild)  Proteinuria, unspecified type  Dyslipidemia      Follow-up plan: Return in about 5 months (around 02/08/2017) for labs and annual physical .  Visit summary with medication list and pertinent instructions was printed for patient to review, alert [place] if any changes needed. All questions at time of visit were answered - patient instructed to contact office with any additional concerns. ER/RTC precautions were reviewed with the patient and understanding verbalized.   Note: Total time spent 25 minutes, greater than 50% of the visit was spent face-to-face counseling and coordinating care for the following: The primary encounter diagnosis was Prediabetes. Diagnoses of Breast cancer screening, Colon cancer screening, Essential hypertension, Chronic kidney disease, stage II (mild), Proteinuria, unspecified type, and Dyslipidemia were also pertinent to this visit.Marland Kitchen

## 2016-09-14 NOTE — Telephone Encounter (Signed)
Patient is scheduled on 02/08/17 for her physical - [facility]

## 2016-09-26 ENCOUNTER — Other Ambulatory Visit: Payer: Self-pay | Admitting: Osteopathic Medicine

## 2016-09-26 DIAGNOSIS — I10 Essential (primary) hypertension: Secondary | ICD-10-CM

## 2016-10-13 ENCOUNTER — Other Ambulatory Visit: Payer: Self-pay | Admitting: Osteopathic Medicine

## 2016-10-13 DIAGNOSIS — I10 Essential (primary) hypertension: Secondary | ICD-10-CM

## 2016-10-14 ENCOUNTER — Ambulatory Visit: Payer: Federal, State, Local not specified - PPO

## 2016-10-28 ENCOUNTER — Other Ambulatory Visit: Payer: Self-pay | Admitting: Osteopathic Medicine

## 2016-10-28 DIAGNOSIS — I10 Essential (primary) hypertension: Secondary | ICD-10-CM

## 2016-11-01 ENCOUNTER — Other Ambulatory Visit: Payer: Self-pay | Admitting: Osteopathic Medicine

## 2016-11-01 DIAGNOSIS — I10 Essential (primary) hypertension: Secondary | ICD-10-CM

## 2016-11-11 ENCOUNTER — Other Ambulatory Visit: Payer: Self-pay | Admitting: Osteopathic Medicine

## 2016-11-11 ENCOUNTER — Ambulatory Visit (INDEPENDENT_AMBULATORY_CARE_PROVIDER_SITE_OTHER): Payer: Federal, State, Local not specified - PPO

## 2016-11-11 DIAGNOSIS — Z1239 Encounter for other screening for malignant neoplasm of breast: Secondary | ICD-10-CM

## 2016-11-11 DIAGNOSIS — Z1231 Encounter for screening mammogram for malignant neoplasm of breast: Secondary | ICD-10-CM | POA: Diagnosis not present

## 2016-11-15 ENCOUNTER — Other Ambulatory Visit: Payer: Self-pay | Admitting: Osteopathic Medicine

## 2016-11-15 DIAGNOSIS — I10 Essential (primary) hypertension: Secondary | ICD-10-CM

## 2016-11-18 ENCOUNTER — Telehealth: Payer: Self-pay | Admitting: *Deleted

## 2016-11-18 NOTE — Telephone Encounter (Signed)
Patient called and requests to change her spironolactone to a different medication because of "side effects with breast that that medication can cause."

## 2016-11-21 NOTE — Telephone Encounter (Signed)
Is she having side effects now? If not, she's been on this medication some time and is unlikely to develop problems, but I'm happy to discuss switch. Medication change will require an office visit to discuss alternatives, given her history of intolerance to other blood pressure medications and her kidney issues. Will need labs and BP check in office.

## 2016-11-22 NOTE — Telephone Encounter (Signed)
Message left on vm 

## 2016-11-27 ENCOUNTER — Other Ambulatory Visit: Payer: Self-pay | Admitting: Osteopathic Medicine

## 2016-11-27 DIAGNOSIS — I10 Essential (primary) hypertension: Secondary | ICD-10-CM

## 2016-12-23 ENCOUNTER — Other Ambulatory Visit: Payer: Self-pay | Admitting: Osteopathic Medicine

## 2016-12-23 DIAGNOSIS — I10 Essential (primary) hypertension: Secondary | ICD-10-CM

## 2017-01-01 ENCOUNTER — Encounter: Payer: Self-pay | Admitting: Emergency Medicine

## 2017-01-01 ENCOUNTER — Emergency Department
Admission: EM | Admit: 2017-01-01 | Discharge: 2017-01-01 | Disposition: A | Discharge status: Home / Self Care | Payer: Federal, State, Local not specified - PPO | Source: Home / Self Care | Attending: Family Medicine | Admitting: Family Medicine

## 2017-01-01 DIAGNOSIS — J069 Acute upper respiratory infection, unspecified: Secondary | ICD-10-CM

## 2017-01-01 DIAGNOSIS — B9789 Other viral agents as the cause of diseases classified elsewhere: Secondary | ICD-10-CM

## 2017-01-01 MED ORDER — IPRATROPIUM BROMIDE 0.06 % NA SOLN: 2.0000 | Freq: Four times a day (QID) | NASAL | 1 refills | Status: DC

## 2017-01-01 MED ORDER — BENZONATATE 100 MG PO CAPS: 100.0000 mg | ORAL_CAPSULE | Freq: Three times a day (TID) | ORAL | 0 refills | Status: DC

## 2017-01-01 NOTE — ED Provider Notes (Signed)
[name]    CSN: [id number] Arrival date & time: 01/01/17  1448     History   Chief Complaint Chief Complaint  Patient presents with  . Cough    HPI Brianna Walton is a 56 y.o. female.   HPI  Brianna Walton is a 56 y.o. female presenting to UC with c/o mildly productive cough with clear sputum for 2 days, mild nasal congestion. Cough is worse at night despite taking OTC cough medication.  Last does of medication was Delsym this morning.  Denies fever, chills, n/v/d. Mild HA she believes from coughing. No known sick contacts. Mild chest soreness from cough but denies SOB. No hx of asthma.     Past Medical History:  Diagnosis Date  . At high risk for altered glucose metabolism   . Hyperlipidemia   . Hypertension     Patient Active Problem List   Diagnosis Date Noted  . Neck pain on left side 07/08/2016  . Headache 07/08/2016  . 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 Proteinuria 09/10/2014  . Vitamin D deficiency 09/10/2014  . 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 Microalbuminuria 06/09/2014  . Lumbar radiculitis 08/08/2013  . Obesity, unspecified 05/08/2013  . Essential hypertension, benign 05/08/2013  . Dyslipidemia 05/08/2013  . Personal history of colonic polyps 05/07/2013    Past Surgical History:  Procedure Laterality Date  . ABDOMINAL HYSTERECTOMY    . CHOLECYSTECTOMY      OB History    No data available       Home Medications    Prior to Admission medications   Medication Sig Start Date End Date Taking? Authorizing Provider  amLODipine-benazepril (LOTREL) 10-40 MG capsule TAKE 1 CAPSULE BY MOUTH DAILY. PLEASE SCHEDULE AN APPT 11/28/16   Emeterio Reeve, DO  benzonatate (TESSALON) 100 MG capsule Take 1-2 capsules (100-200 mg total) by mouth every 8 (eight) hours. 01/01/17   Noe Gens, PA-C  cloNIDine (CATAPRES) 0.2 MG tablet TAKE 1 TABLET BY MOUTH TWICE A DAY  12/23/16   Emeterio Reeve, DO  ipratropium (ATROVENT) 0.06 % nasal spray Place 2 sprays into both nostrils 4 (four) times daily. 01/01/17   Noe Gens, PA-C  metFORMIN (GLUCOPHAGE) 500 MG tablet Take 1 tablet (500 mg total) by mouth 2 (two) times daily with a meal. 01/28/16   Emeterio Reeve, DO  spironolactone (ALDACTONE) 25 MG tablet TAKE 1 TABLET (25 MG TOTAL) BY MOUTH DAILY. 11/01/16   Emeterio Reeve, DO    Family History Family History  Problem Relation Age of Onset  . Diabetes Mother   . Hypertension Mother   . Hypertension Father     Social History Social History   Tobacco Use  . Smoking status: Never Smoker  . Smokeless tobacco: Never Used  Substance Use Topics  . Alcohol use: No  . Drug use: No     Allergies   Atenolol; Hctz [hydrochlorothiazide]; Labetalol; and Penicillins   Review of Systems Review of Systems  Constitutional: Negative for chills and fever.  HENT: Positive for congestion and postnasal drip. Negative for ear pain, sore throat, trouble swallowing and voice change.   Respiratory: Positive for cough. Negative for shortness of breath.   Cardiovascular: Negative for chest pain and palpitations.  Gastrointestinal: Negative for abdominal pain, diarrhea, nausea and vomiting.  Musculoskeletal: Negative for arthralgias, back pain and myalgias.  Skin: Negative for rash.     Physical Exam Triage Vital Signs ED Triage Vitals  Enc Vitals Group     BP 01/01/17 1534 (!) 145/96     Pulse Rate 01/01/17 1534 (!) 104     Resp --      Temp 01/01/17 1534 98.7 F (37.1 C)     Temp Source 01/01/17 1534 Oral     SpO2 01/01/17 1534 97 %     Weight 01/01/17 1535 210 lb (95.3 kg)     Height 01/01/17 1535 5\' 4"  (1.626 m)     Head Circumference --      Peak Flow --      Pain Score 01/01/17 1535 0     Pain Loc --      Pain Edu? --      Excl. in [place]? --    No data found.  Updated Vital Signs BP (!) 145/96 (BP Location: Right Arm)   Pulse (!) 104    Temp 98.7 F (37.1 C) (Oral)   Ht 5\' 4"  (1.626 m)   Wt 210 lb (95.3 kg)   SpO2 97%   BMI 36.05 kg/m   Visual Acuity Right Eye Distance:   Left Eye Distance:   Bilateral Distance:    Right Eye Near:   Left Eye Near:    Bilateral Near:     Physical Exam  Constitutional: She is oriented to person, place, and time. She appears well-developed and well-nourished. No distress.  HENT:  Head: Normocephalic and atraumatic.  Right Ear: Tympanic membrane normal.  Left Ear: Tympanic membrane normal.  Nose: Mucosal edema present. Right sinus exhibits no maxillary sinus tenderness and no frontal sinus tenderness. Left sinus exhibits no maxillary sinus tenderness and no frontal sinus tenderness.  Mouth/Throat: Uvula is midline, oropharynx is clear and moist and mucous membranes are normal.  Eyes: EOM are normal.  Neck: Normal range of motion. Neck supple.  Cardiovascular: Normal rate and regular rhythm.  Pulmonary/Chest: Effort normal and breath sounds normal. No stridor. No respiratory distress. She has no wheezes. She has no rales.  Musculoskeletal: Normal range of motion.  Lymphadenopathy:    She has no cervical adenopathy.  Neurological: She is alert and oriented to person, place, and time.  Skin: Skin is warm and dry. She is not diaphoretic.  Psychiatric: She has a normal mood and affect. Her behavior is normal.  Nursing note and vitals reviewed.    UC Treatments / Results  Labs (all labs ordered are listed, but only abnormal results are displayed) Labs Reviewed - No data to display  EKG  EKG Interpretation None       Radiology No results found.  Procedures Procedures (including critical care time)  Medications Ordered in UC Medications - No data to display   Initial Impression / Assessment and Plan / UC Course  I have reviewed the triage vital signs and the nursing notes.  Pertinent labs & imaging results that were available during my care of the patient were  reviewed by me and considered in my medical decision making (see chart for details).     Hx and exam c/w viral illness Encouraged symptomatic treatment F/u with PCP in 1 week if not improving.   Final Clinical Impressions(s) / UC Diagnoses   Final diagnoses:  Viral URI with cough    ED Discharge Orders        Ordered    benzonatate (TESSALON) 100 MG capsule  Every 8 hours     01/01/17 1545    ipratropium (ATROVENT) 0.06 % nasal spray  4 times daily  01/01/17 1545       Controlled Substance Prescriptions [place] Controlled Substance Registry consulted? Not Applicable   Noe Gens, PA-C 01/01/17 1700

## 2017-01-01 NOTE — Discharge Instructions (Signed)
°  You may take 500mg  acetaminophen every 4-6 hours or in combination with your Meloxicam as needed for pain, inflammation, and fever.  Be sure to drink at least eight 8oz glasses of water to stay well hydrated and get at least 8 hours of sleep at night, preferably more while sick.   For the Ipratropium nasal spray, be sure to use as prescribed to help prevent post-nasal drip, which can trigger coughing, especially at night.  Use 2 sprays per nostril 4 times daily while sick.  You should spray one time in each nostril pointing the spray to the out portion of your nostril, breath in slowly while spraying. Wait about 30 seconds to 1 minute before given the second spray in each nostril.  This will ensure you get the most benefit from each spray.

## 2017-01-01 NOTE — ED Triage Notes (Signed)
Patient complaining for productive cough x 2 days w/clear mucus.  Patient took Meloxicam a few hours ago.  Delsym this am @ 8:00am.

## 2017-01-02 ENCOUNTER — Encounter: Payer: Self-pay | Admitting: Family Medicine

## 2017-01-02 ENCOUNTER — Ambulatory Visit (INDEPENDENT_AMBULATORY_CARE_PROVIDER_SITE_OTHER): Payer: Federal, State, Local not specified - PPO

## 2017-01-02 ENCOUNTER — Ambulatory Visit (INDEPENDENT_AMBULATORY_CARE_PROVIDER_SITE_OTHER): Payer: Federal, State, Local not specified - PPO | Admitting: Family Medicine

## 2017-01-02 VITALS — BP 146/93 | HR 112 | Temp 98.4°F | Ht 64.0 in | Wt 206.0 lb

## 2017-01-02 DIAGNOSIS — R079 Chest pain, unspecified: Secondary | ICD-10-CM

## 2017-01-02 DIAGNOSIS — R05 Cough: Secondary | ICD-10-CM

## 2017-01-02 DIAGNOSIS — R059 Cough, unspecified: Secondary | ICD-10-CM

## 2017-01-02 MED ORDER — IPRATROPIUM-ALBUTEROL 0.5-2.5 (3) MG/3ML IN SOLN
3.0000 mL | Freq: Once | RESPIRATORY_TRACT | Status: AC
  Administered 2017-01-02: 3 mL via RESPIRATORY_TRACT

## 2017-01-02 MED ORDER — GUAIFENESIN-CODEINE 100-10 MG/5ML PO SOLN: 5.0000 mL | Freq: Four times a day (QID) | ORAL | 0 refills | Status: DC | PRN

## 2017-01-02 MED ORDER — ALBUTEROL SULFATE HFA 108 (90 BASE) MCG/ACT IN AERS: 2.0000 | INHALATION_SPRAY | Freq: Four times a day (QID) | RESPIRATORY_TRACT | 0 refills | Status: DC | PRN

## 2017-01-02 MED ORDER — PREDNISONE 10 MG PO TABS: 30.0000 mg | ORAL_TABLET | Freq: Every day | ORAL | 0 refills | Status: DC

## 2017-01-02 NOTE — Patient Instructions (Signed)
Thank you for coming in today. Get xray now.  Use albuterol as needed for cough or wheezing or chest tight.  Use the codeine cough medicine up to every 6 hours but mostly take it at night.  This medicine may make you sleepy.  Take prednisone daily for 5 days.  Call or go to the emergency room if you get worse, have trouble breathing, have chest pains, or palpitations.    Acute Bronchitis, Adult Acute bronchitis is when air tubes (bronchi) in the lungs suddenly get swollen. The condition can make it hard to breathe. It can also cause these symptoms:  A cough.  Coughing up clear, yellow, or green mucus.  Wheezing.  Chest congestion.  Shortness of breath.  A fever.  Body aches.  Chills.  A sore throat.  Follow these instructions at home: Medicines  Take over-the-counter and prescription medicines only as told by your doctor.  If you were prescribed an antibiotic medicine, take it as told by your doctor. Do not stop taking the antibiotic even if you start to feel better. General instructions  Rest.  Drink enough fluids to keep your pee (urine) clear or pale yellow.  Avoid smoking and secondhand smoke. If you smoke and you need help quitting, ask your doctor. Quitting will help your lungs heal faster.  Use an inhaler, cool mist vaporizer, or humidifier as told by your doctor.  Keep all follow-up visits as told by your doctor. This is important. How is this prevented? To lower your risk of getting this condition again:  Wash your hands often with soap and water. If you cannot use soap and water, use hand sanitizer.  Avoid contact with people who have cold symptoms.  Try not to touch your hands to your mouth, nose, or eyes.  Make sure to get the flu shot every year.  Contact a doctor if:  Your symptoms do not get better in 2 weeks. Get help right away if:  You cough up blood.  You have chest pain.  You have very bad shortness of breath.  You become  dehydrated.  You faint (pass out) or keep feeling like you are going to pass out.  You keep throwing up (vomiting).  You have a very bad headache.  Your fever or chills gets worse. This information is not intended to replace advice given to you by your health care provider. Make sure you discuss any questions you have with your health care provider. Document Released: 06/22/2007 Document Revised: 08/12/2015 Document Reviewed: 06/24/2015 Elsevier Interactive Patient Education  2017 [employer].

## 2017-01-02 NOTE — Progress Notes (Signed)
Brianna Walton is a 56 y.o. female who presents to [facility] today for cough congestion runny nose.  Symptoms present for about 4 days.  The cough is occasionally productive.  The cough is quite problematic and interferes with sleep.  She was seen in urgent care the other day where she was thought to have a viral process and given the Tessalon Perles and Atrovent nasal spray which have not helped.  She has tried some over-the-counter medicines as well which also did not help much.   Past Medical History:  Diagnosis Date  . At high risk for altered glucose metabolism   . Hyperlipidemia   . Hypertension    Past Surgical History:  Procedure Laterality Date  . ABDOMINAL HYSTERECTOMY    . CHOLECYSTECTOMY     Social History   Tobacco Use  . Smoking status: Never Smoker  . Smokeless tobacco: Never Used  Substance Use Topics  . Alcohol use: No   family history includes Diabetes in her mother; Hypertension in her father and mother.  ROS as above:  Medications: Current Outpatient Medications  Medication Sig Dispense Refill  . amLODipine-benazepril (LOTREL) 10-40 MG capsule TAKE 1 CAPSULE BY MOUTH DAILY. PLEASE SCHEDULE AN APPT 30 capsule 1  . benzonatate (TESSALON) 100 MG capsule Take 1-2 capsules (100-200 mg total) by mouth every 8 (eight) hours. 21 capsule 0  . cloNIDine (CATAPRES) 0.2 MG tablet TAKE 1 TABLET BY MOUTH TWICE A DAY 60 tablet 0  . ipratropium (ATROVENT) 0.06 % nasal spray Place 2 sprays into both nostrils 4 (four) times daily. 15 mL 1  . metFORMIN (GLUCOPHAGE) 500 MG tablet Take 1 tablet (500 mg total) by mouth 2 (two) times daily with a meal. 180 tablet 3  . spironolactone (ALDACTONE) 25 MG tablet TAKE 1 TABLET (25 MG TOTAL) BY MOUTH DAILY. 90 tablet 1  . albuterol (PROVENTIL HFA;VENTOLIN HFA) 108 (90 Base) MCG/ACT inhaler Inhale 2 puffs into the lungs  every 6 (six) hours as needed for wheezing or shortness of breath. 1 Inhaler 0  . guaiFENesin-codeine 100-10 MG/5ML syrup Take 5 mLs by mouth every 6 (six) hours as needed for cough. 120 mL 0  . predniSONE (DELTASONE) 10 MG tablet Take 3 tablets (30 mg total) by mouth daily with breakfast. 15 tablet 0   No current facility-administered medications for this visit.    Allergies  Allergen Reactions  . Atenolol     Achy.   . Hctz [Hydrochlorothiazide]     Achy all over/Gout  . Labetalol   . Penicillins     Health Maintenance Health Maintenance  Topic Date Due  . PNEUMOCOCCAL POLYSACCHARIDE VACCINE (1) 09[birth year]02/1961  . PAP SMEAR  04/30/2016  . OPHTHALMOLOGY EXAM  11/16/2016  . COLONOSCOPY  01/17/2017 (Originally 01/18/2015)  . INFLUENZA VACCINE  09/08/2017 (Originally 08/17/2016)  . Hepatitis C Screening  01/17/2018 (Originally [birth year])  . HIV Screening  01/17/2018 (Originally 09[birth year]1974)  . FOOT EXAM  01/27/2017  . HEMOGLOBIN A1C  03/11/2017  . MAMMOGRAM  11/12/2018  . TETANUS/TDAP  01/22/2023     Exam:  BP (!) 146/93   Pulse (!) 112   Temp 98.4 F (36.9 C) (Oral)   Ht 5\' 4"  (1.626 m)   Wt 206 lb (93.4 kg)   SpO2 97%   BMI 35.36 kg/m  Gen: Well NAD HEENT: EOMI,  MMM clear nasal discharge.  Posterior pharynx with cobblestoning.  Normal tympanic membranes. Lungs: Normal work  of breathing.  Wheezing present bilaterally Heart: RRR no MRG Abd: NABS, Soft. Nondistended, Nontender Exts: Brisk capillary refill, warm and well perfused.   Patient was given a 2.5/0.5 mg DuoNeb nebulizer treatment and felt better.  Her lung exam also improved.  Chest x-ray pending   No results found for this or any previous visit (from the past 72 hour(s)). No results found.    Assessment and Plan: 56 y.o. female with bronchitis with cough and wheezing.  Plan for chest x-ray to evaluate for pneumonia.  Treat empirically with prednisone and codeine cough syrup.  Will use albuterol as well.   Recheck if needed.   Orders Placed This Encounter  Procedures  . DG Chest 2 View    Order Specific Question:   Reason for exam:    Answer:   Cough, assess intra-thoracic pathology    Order Specific Question:   Is the patient pregnant?    Answer:   No    Order Specific Question:   Preferred imaging location?    Answer:   [facility]   Meds ordered this encounter  Medications  . ipratropium-albuterol (DUONEB) 0.5-2.5 (3) MG/3ML nebulizer solution 3 mL  . predniSONE (DELTASONE) 10 MG tablet    Sig: Take 3 tablets (30 mg total) by mouth daily with breakfast.    Dispense:  15 tablet    Refill:  0  . guaiFENesin-codeine 100-10 MG/5ML syrup    Sig: Take 5 mLs by mouth every 6 (six) hours as needed for cough.    Dispense:  120 mL    Refill:  0  . albuterol (PROVENTIL HFA;VENTOLIN HFA) 108 (90 Base) MCG/ACT inhaler    Sig: Inhale 2 puffs into the lungs every 6 (six) hours as needed for wheezing or shortness of breath.    Dispense:  1 Inhaler    Refill:  0     Discussed warning signs or symptoms. Please see discharge instructions. Patient expresses understanding.

## 2017-01-06 ENCOUNTER — Telehealth: Payer: Self-pay | Admitting: Osteopathic Medicine

## 2017-01-06 MED ORDER — AZITHROMYCIN 250 MG PO TABS: 250.0000 mg | ORAL_TABLET | Freq: Every day | ORAL | 0 refills | Status: DC

## 2017-01-06 NOTE — Telephone Encounter (Signed)
Called patient to advise that antibiotic has been sent to pharmacy. Rhonda Cunningham,CMA

## 2017-01-06 NOTE — Telephone Encounter (Signed)
Pt was seen in clinic on Monday for her cough. She was advised she did not have pneumonia, but now the cough is "deeper in her chest." Pt reports coughing up yellow/dark mucous. Routing for review to see if new xray or OV is appropriate.

## 2017-01-06 NOTE — Telephone Encounter (Signed)
Azithromycin send to [facility]. This is an antibiotic.  If not getting better go to ED or Urgent Care.

## 2017-01-19 ENCOUNTER — Other Ambulatory Visit: Payer: Self-pay

## 2017-01-19 DIAGNOSIS — I10 Essential (primary) hypertension: Secondary | ICD-10-CM

## 2017-01-19 MED ORDER — CLONIDINE HCL 0.2 MG PO TABS: 0.2000 mg | ORAL_TABLET | Freq: Two times a day (BID) | ORAL | 0 refills | Status: DC

## 2017-01-23 ENCOUNTER — Other Ambulatory Visit: Payer: Self-pay

## 2017-01-23 DIAGNOSIS — I10 Essential (primary) hypertension: Secondary | ICD-10-CM

## 2017-01-23 MED ORDER — AMLODIPINE BESY-BENAZEPRIL HCL 10-40 MG PO CAPS: 1.0000 | ORAL_CAPSULE | Freq: Every day | ORAL | 0 refills | Status: DC

## 2017-02-08 ENCOUNTER — Ambulatory Visit (INDEPENDENT_AMBULATORY_CARE_PROVIDER_SITE_OTHER): Payer: Federal, State, Local not specified - PPO | Admitting: Osteopathic Medicine

## 2017-02-08 ENCOUNTER — Encounter: Payer: Self-pay | Admitting: Osteopathic Medicine

## 2017-02-08 VITALS — BP 135/88 | HR 87 | Temp 98.3°F | Ht 64.0 in | Wt 208.7 lb

## 2017-02-08 DIAGNOSIS — Z0000 Encounter for general adult medical examination without abnormal findings: Secondary | ICD-10-CM

## 2017-02-08 DIAGNOSIS — I10 Essential (primary) hypertension: Secondary | ICD-10-CM | POA: Diagnosis not present

## 2017-02-08 DIAGNOSIS — R7303 Prediabetes: Secondary | ICD-10-CM

## 2017-02-08 DIAGNOSIS — N182 Chronic kidney disease, stage 2 (mild): Secondary | ICD-10-CM

## 2017-02-08 DIAGNOSIS — E785 Hyperlipidemia, unspecified: Secondary | ICD-10-CM | POA: Diagnosis not present

## 2017-02-08 DIAGNOSIS — Z789 Other specified health status: Secondary | ICD-10-CM

## 2017-02-08 LAB — POCT GLYCOSYLATED HEMOGLOBIN (HGB A1C): Hemoglobin A1C: 6.7

## 2017-02-08 MED ORDER — METFORMIN HCL ER 500 MG PO TB24: 500.0000 mg | ORAL_TABLET | Freq: Two times a day (BID) | ORAL | 3 refills | Status: DC

## 2017-02-08 MED ORDER — NEBIVOLOL HCL 5 MG PO TABS: 10.0000 mg | ORAL_TABLET | Freq: Every day | ORAL | 1 refills | Status: DC

## 2017-02-08 NOTE — Patient Instructions (Addendum)
Following medication changes were made today:  For blood pressure, we are going to change the clonidine to Bystolic/nebivolol. This may require prior authorization since this is a brand name medication. Once you have started the new medicine, plan to return to the clinic for a nurse visit to recheck blood pressure in 2 weeks.  We changed the metformin to extended release formulation to hopefully help with a lot of coupon was printed for this, usually the cash price is much cheaper than going through insurance.

## 2017-02-08 NOTE — Progress Notes (Signed)
HPI: Brianna Walton is a 57 y.o. female who  has a past medical history of At high risk for altered glucose metabolism, Hyperlipidemia, and Hypertension.  she presents to [facility] today, 02/08/17,  for chief complaint of: Physical   Pre-DM:  Doing fairly well with diet, not much in the way of formal exercise. A1C is  noted below. Last done 08/2016 and was 6.3. Up a little bit more today to 6.7, we discussed diet/exercise, she hasn't been particularly diligent about these through the holidays.  Hypertension:  Doing ok on current medications and states the clonidine gives her some issues with tiredness. History of intolerance to beta blockers also d/t tiredness, and thiazide diuretics caused joint issues but doing well on amlodipine-benazapril,, spironolactone.  CKD:  Mild persistent elevation in creatinine, history of proteinuria.   Screening measures: Due for colon cancer screening - Cologuard ordered but never resulted, patient states that she measured her own FOBT, she works in the lab. Breast cancer screening UTD. Status post hysterectomy     Past medical, surgical, social and family history reviewed:  Patient Active Problem List   Diagnosis Date Noted  . Neck pain on left side 07/08/2016  . Headache 07/08/2016  . 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 Proteinuria 09/10/2014  . Vitamin D deficiency 09/10/2014  . 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 Microalbuminuria 06/09/2014  . Lumbar radiculitis 08/08/2013  . Obesity, unspecified 05/08/2013  . Essential hypertension, benign 05/08/2013  . Dyslipidemia 05/08/2013  . Personal history of colonic polyps 05/07/2013    Past Surgical History:  Procedure Laterality Date  . ABDOMINAL HYSTERECTOMY    . CHOLECYSTECTOMY      Social History   Tobacco Use  . Smoking status: Never Smoker  . Smokeless tobacco: Never Used   Substance Use Topics  . Alcohol use: No    Family History  Problem Relation Age of Onset  . Diabetes Mother   . Hypertension Mother   . Hypertension Father      Current medication list and allergy/intolerance information reviewed:    Current Outpatient Medications  Medication Sig Dispense Refill  . albuterol (PROVENTIL HFA;VENTOLIN HFA) 108 (90 Base) MCG/ACT inhaler Inhale 2 puffs into the lungs every 6 (six) hours as needed for wheezing or shortness of breath. 1 Inhaler 0  . amLODipine-benazepril (LOTREL) 10-40 MG capsule Take 1 capsule by mouth daily. Pt needs to keep appt. 30 capsule 0  . azithromycin (ZITHROMAX) 250 MG tablet Take 1 tablet (250 mg total) by mouth daily. Take first 2 tablets together, then 1 every day until finished. 6 tablet 0  . benzonatate (TESSALON) 100 MG capsule Take 1-2 capsules (100-200 mg total) by mouth every 8 (eight) hours. 21 capsule 0  . cloNIDine (CATAPRES) 0.2 MG tablet Take 1 tablet (0.2 mg total) by mouth 2 (two) times daily. 60 tablet 0  . guaiFENesin-codeine 100-10 MG/5ML syrup Take 5 mLs by mouth every 6 (six) hours as needed for cough. 120 mL 0  . ipratropium (ATROVENT) 0.06 % nasal spray Place 2 sprays into both nostrils 4 (four) times daily. 15 mL 1  . metFORMIN (GLUCOPHAGE) 500 MG tablet Take 1 tablet (500 mg total) by mouth 2 (two) times daily with a meal. 180 tablet 3  . predniSONE (DELTASONE) 10 MG tablet Take 3 tablets (30 mg total) by mouth daily with breakfast. 15 tablet 0  . spironolactone (ALDACTONE) 25 MG tablet TAKE  1 TABLET (25 MG TOTAL) BY MOUTH DAILY. 90 tablet 1   No current facility-administered medications for this visit.     Allergies  Allergen Reactions  . Atenolol     Achy.   . Hctz [Hydrochlorothiazide]     Achy all over/Gout  . Labetalol   . Penicillins       Review of Systems:  Constitutional:  No  fever, no chills, No recent illness, No unintentional weight changes. +significant fatigue.   HEENT: No   headache, no vision change, no hearing change, No sore throat, No  sinus pressure  Cardiac: No  chest pain, No  pressure, No palpitation  Respiratory:  No  shortness of breath. No  Cough  Gastrointestinal: No  abdominal pain, No  nausea, No  vomiting,  No  blood in stool, No  diarrhea, No  constipation    Musculoskeletal: No new myalgia/arthralgia  Genitourinary: No  incontinence  Skin: No  Rash  Hem/Onc: No  easy bruising/bleeding, No  abnormal lymph node  Endocrine: No polyuria/polydipsia/polyphagia   Neurologic: No  weakness, No  dizziness  Psychiatric: No  concerns with depression, No  concerns with anxiety, No sleep problems, No mood problems  Exam:  BP 135/88   Pulse 87   Temp 98.3 F (36.8 C) (Oral)   Ht 5\' 4"  (1.626 m)   Wt 208 lb 11.2 oz (94.7 kg)   BMI 35.82 kg/m   Constitutional: VS see above. General Appearance: alert, well-developed, well-nourished, NAD  Eyes: Normal lids and conjunctive, non-icteric sclera  Ears, Nose, Mouth, Throat: MMM, Normal external inspection ears/nares/mouth/lips/gums. TM normal bilaterally. Pharynx/tonsils no erythema, no exudate. Nasal mucosa normal.   Neck: No masses, trachea midline. No thyroid enlargement. No tenderness/mass appreciated. No lymphadenopathy  Respiratory: Normal respiratory effort. no wheeze, no rhonchi, no rales  Cardiovascular: S1/S2 normal, no murmur, no rub/gallop auscultated. RRR. No lower extremity edema.   Gastrointestinal: Nontender, no masses. No hepatomegaly, no splenomegaly. No hernia appreciated. Bowel sounds normal. Rectal exam deferred.   Musculoskeletal: Gait normal. No clubbing/cyanosis of digits.   Neurological: Normal balance/coordination. No tremor.   Skin: warm, dry, intact. No rash/ulcer. Marland Kitchen    Psychiatric: Normal judgment/insight. Normal mood and affect. Oriented x3.    Results for orders placed or performed in visit on 02/08/17 (from the past 72 hour(s))  POCT HgB A1C     Status:  None   Collection Time: 02/08/17  9:13 AM  Result Value Ref Range   Hemoglobin A1C 6.7      ASSESSMENT/PLAN:   Annual physical exam  Prediabetes - Plan: POCT HgB A1C, metFORMIN (GLUCOPHAGE XR) 500 MG 24 hr tablet  Chronic kidney disease, stage II (mild) - Plan: CBC, COMPLETE METABOLIC PANEL WITH GFR, VITAMIN D 25 Hydroxy (Vit-D Deficiency, Fractures), Microalbumin / creatinine urine ratio  Dyslipidemia - Plan: Lipid panel  Essential hypertension - Plan: nebivolol (BYSTOLIC) 5 MG tablet, Lipid panel, TSH  Pre-diabetes  Beta-blocker intolerance - Plan: nebivolol (BYSTOLIC) 5 MG tablet   Patient Instructions  Following medication changes were made today:  For blood pressure, we are going to change the clonidine to Bystolic/nebivolol. This may require prior authorization since this is a brand name medication. Once you have started the new medicine, plan to return to the clinic for a nurse visit to recheck blood pressure in 2 weeks.  We changed the metformin to extended release formulation to hopefully help with a lot of coupon was printed for this, usually the cash price is much cheaper  than going through insurance.     FEMALE PREVENTIVE CARE Updated 02/08/17   ANNUAL SCREENING/COUNSELING  Diet/Exercise - HEALTHY HABITS DISCUSSED TO DECREASE CV RISK Social History   Tobacco Use  Smoking Status Never Smoker  Smokeless Tobacco Never Used    Social History   Substance and Sexual Activity  Alcohol Use No    Depression screen PHQ 2/9 08/05/2016  Decreased Interest 0  Down, Depressed, Hopeless 0  PHQ - 2 Score 0     Domestic violence concerns - no  HTN SCREENING - SEE [place]  Sexually active in the past year - Yes with female.  Need/want STI testing today? - no  Concerns about libido or pain with sex? - no  Plans for pregnancy? 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 Influenza-Unspecified 10/17/2013    OTHER  Fall - exercise and Vit D age 44+ - does not need    Visit summary with medication list and pertinent instructions was printed for patient to review. All questions at time of visit were answered - patient instructed to contact office with any additional concerns. ER/RTC precautions were reviewed with the patient.   Follow-up plan: Return in about 3 months (around 05/09/2017) for recheck  A1C, sooner though to recheck BP 2 weeks after strating Bystolic .   Please note: voice recognition software was used to produce this document, and typos may escape review. Please contact Dr. Sheppard Coil for any needed clarifications.

## 2017-02-09 LAB — LIPID PANEL
Cholesterol: 193 mg/dL (ref <200)
HDL: 37 mg/dL — AB (ref >50)
LDL CHOLESTEROL (CALC): 126 mg/dL — AB
NON-HDL CHOLESTEROL (CALC): 156 mg/dL — AB (ref <130)
TRIGLYCERIDES: 186 mg/dL — AB (ref <150)
Total CHOL/HDL Ratio: 5.2 (calc) — ABNORMAL HIGH (ref <5.0)

## 2017-02-09 LAB — CBC
HCT: 41.9 % (ref 35.0–45.0)
Hemoglobin: 13.8 g/dL (ref 11.7–15.5)
MCH: 23.8 pg — ABNORMAL LOW (ref 27.0–33.0)
MCHC: 32.9 g/dL (ref 32.0–36.0)
MCV: 72.2 fL — ABNORMAL LOW (ref 80.0–100.0)
MPV: 11.9 fL (ref 7.5–12.5)
Platelets: 422 10*3/uL — ABNORMAL HIGH (ref 140–400)
RBC: 5.8 10*6/uL — AB (ref 3.80–5.10)
RDW: 14.7 % (ref 11.0–15.0)
WBC: 7.4 10*3/uL (ref 3.8–10.8)

## 2017-02-09 LAB — COMPLETE METABOLIC PANEL WITH GFR
AG RATIO: 1.4 (calc) (ref 1.0–2.5)
ALT: 29 U/L (ref 6–29)
AST: 29 U/L (ref 10–35)
Albumin: 4.4 g/dL (ref 3.6–5.1)
Alkaline phosphatase (APISO): 81 U/L (ref 33–130)
BUN/Creatinine Ratio: 12 (calc) (ref 6–22)
BUN: 14 mg/dL (ref 7–25)
CALCIUM: 9.8 mg/dL (ref 8.6–10.4)
CO2: 31 mmol/L (ref 20–32)
CREATININE: 1.16 mg/dL — AB (ref 0.50–1.05)
Chloride: 102 mmol/L (ref 98–110)
GFR, EST AFRICAN AMERICAN: 61 mL/min/{1.73_m2} (ref >=60)
GFR, EST NON AFRICAN AMERICAN: 52 mL/min/{1.73_m2} — AB (ref >=60)
GLUCOSE: 111 mg/dL — AB (ref 65–99)
Globulin: 3.2 g/dL (calc) (ref 1.9–3.7)
POTASSIUM: 3.9 mmol/L (ref 3.5–5.3)
Sodium: 139 mmol/L (ref 135–146)
TOTAL PROTEIN: 7.6 g/dL (ref 6.1–8.1)
Total Bilirubin: 0.6 mg/dL (ref 0.2–1.2)

## 2017-02-09 LAB — VITAMIN D 25 HYDROXY (VIT D DEFICIENCY, FRACTURES): VIT D 25 HYDROXY: 21 ng/mL — AB (ref 30–100)

## 2017-02-09 LAB — TSH: TSH: 2.16 mIU/L (ref 0.40–4.50)

## 2017-02-09 LAB — MICROALBUMIN / CREATININE URINE RATIO
CREATININE, URINE: 69 mg/dL (ref 20–275)
MICROALB UR: 19.9 mg/dL
MICROALB/CREAT RATIO: 288 ug/mg{creat} — AB (ref <30)

## 2017-02-13 ENCOUNTER — Other Ambulatory Visit: Payer: Self-pay | Admitting: Osteopathic Medicine

## 2017-02-13 DIAGNOSIS — E782 Mixed hyperlipidemia: Secondary | ICD-10-CM

## 2017-02-13 MED ORDER — ATORVASTATIN CALCIUM 20 MG PO TABS: 20.0000 mg | ORAL_TABLET | Freq: Every day | ORAL | 3 refills | Status: DC

## 2017-02-13 NOTE — Progress Notes (Signed)
Adding statin

## 2017-02-20 ENCOUNTER — Telehealth: Payer: Self-pay

## 2017-02-20 DIAGNOSIS — I10 Essential (primary) hypertension: Secondary | ICD-10-CM

## 2017-02-20 NOTE — Telephone Encounter (Signed)
Pt called this afternoon stating that she feels clonidine rx is making her blood pressure elevate. Pt states she is going to take clonidine rx as directed. Requesting feed back from pcp. Thanks.

## 2017-02-21 NOTE — Telephone Encounter (Signed)
Not sure what she means by clonidine making her blood pressure go up, this is a medicine to treat blood pressure. If she has questions about this she should schedule an appointment

## 2017-02-21 NOTE — Telephone Encounter (Signed)
Left vm msg for pt, regarding provider's note.

## 2017-02-21 NOTE — Telephone Encounter (Signed)
As per pt, clarified message regarding bp medication. No longer wants to take bystolic medication due to elevated bp & palpitations. Requesting a rx for clonidine sent to her local [facility] pharmacy. Thanks.

## 2017-02-22 ENCOUNTER — Ambulatory Visit: Payer: Federal, State, Local not specified - PPO

## 2017-02-22 MED ORDER — CLONIDINE HCL 0.2 MG PO TABS: 0.2000 mg | ORAL_TABLET | Freq: Two times a day (BID) | ORAL | 3 refills | Status: DC

## 2017-02-22 NOTE — Telephone Encounter (Signed)
Left a vm msg for pt regarding change in bp medication. Clonidine rx sent to pt's local pharmacy.

## 2017-02-22 NOTE — Telephone Encounter (Signed)
Rx sent!  Took bystolic off her list

## 2017-03-06 ENCOUNTER — Other Ambulatory Visit: Payer: Self-pay

## 2017-03-06 DIAGNOSIS — I10 Essential (primary) hypertension: Secondary | ICD-10-CM

## 2017-03-06 MED ORDER — AMLODIPINE BESY-BENAZEPRIL HCL 10-40 MG PO CAPS: 1.0000 | ORAL_CAPSULE | Freq: Every day | ORAL | 0 refills | Status: DC

## 2017-05-09 ENCOUNTER — Ambulatory Visit: Payer: Federal, State, Local not specified - PPO | Admitting: Osteopathic Medicine

## 2017-05-09 DIAGNOSIS — Z0189 Encounter for other specified special examinations: Secondary | ICD-10-CM

## 2017-05-30 ENCOUNTER — Other Ambulatory Visit: Payer: Self-pay | Admitting: Osteopathic Medicine

## 2017-05-30 DIAGNOSIS — I10 Essential (primary) hypertension: Secondary | ICD-10-CM

## 2017-06-14 ENCOUNTER — Ambulatory Visit (INDEPENDENT_AMBULATORY_CARE_PROVIDER_SITE_OTHER): Payer: Federal, State, Local not specified - PPO | Admitting: Osteopathic Medicine

## 2017-06-14 ENCOUNTER — Encounter: Payer: Self-pay | Admitting: Osteopathic Medicine

## 2017-06-14 VITALS — BP 130/85 | HR 98 | Temp 98.4°F | Wt 211.4 lb

## 2017-06-14 DIAGNOSIS — E785 Hyperlipidemia, unspecified: Secondary | ICD-10-CM | POA: Diagnosis not present

## 2017-06-14 DIAGNOSIS — I10 Essential (primary) hypertension: Secondary | ICD-10-CM | POA: Diagnosis not present

## 2017-06-14 DIAGNOSIS — E119 Type 2 diabetes mellitus without complications: Secondary | ICD-10-CM | POA: Insufficient documentation

## 2017-06-14 DIAGNOSIS — E1169 Type 2 diabetes mellitus with other specified complication: Secondary | ICD-10-CM

## 2017-06-14 DIAGNOSIS — R002 Palpitations: Secondary | ICD-10-CM | POA: Diagnosis not present

## 2017-06-14 LAB — POCT GLYCOSYLATED HEMOGLOBIN (HGB A1C): Hemoglobin A1C: 6.8 % — AB (ref 4.0–5.6)

## 2017-06-14 MED ORDER — AMLODIPINE BESY-BENAZEPRIL HCL 10-40 MG PO CAPS: 1.0000 | ORAL_CAPSULE | Freq: Every day | ORAL | 3 refills | Status: DC

## 2017-06-14 NOTE — Patient Instructions (Signed)
Diabetes Mellitus and Nutrition When you have diabetes (diabetes mellitus), it is very important to have healthy eating habits because your blood sugar (glucose) levels are greatly affected by what you eat and drink. Eating healthy foods in the appropriate amounts, at about the same times every day, can help you:  Control your blood glucose.  Lower your risk of heart disease.  Improve your blood pressure.  Reach or maintain a healthy weight.  Every person with diabetes is different, and each person has different needs for a meal plan. Your health care provider may recommend that you work with a diet and nutrition specialist (dietitian) to make a meal plan that is best for you. Your meal plan may vary depending on factors such as:  The calories you need.  The medicines you take.  Your weight.  Your blood glucose, blood pressure, and cholesterol levels.  Your activity level.  Other health conditions you have, such as heart or kidney disease.  How do carbohydrates affect me? Carbohydrates affect your blood glucose level more than any other type of food. Eating carbohydrates naturally increases the amount of glucose in your blood. Carbohydrate counting is a method for keeping track of how many carbohydrates you eat. Counting carbohydrates is important to keep your blood glucose at a healthy level, especially if you use insulin or take certain oral diabetes medicines. It is important to know how many carbohydrates you can safely have in each meal. This is different for every person. Your dietitian can help you calculate how many carbohydrates you should have at each meal and for snack. Foods that contain carbohydrates include:  Bread, cereal, rice, pasta, and crackers.  Potatoes and corn.  Peas, beans, and lentils.  Milk and yogurt.  Fruit and juice.  Desserts, such as cakes, cookies, ice cream, and candy.  How does alcohol affect me? Alcohol can cause a sudden decrease in blood  glucose (hypoglycemia), especially if you use insulin or take certain oral diabetes medicines. Hypoglycemia can be a life-threatening condition. Symptoms of hypoglycemia (sleepiness, dizziness, and confusion) are similar to symptoms of having too much alcohol. If your health care provider says that alcohol is safe for you, follow these guidelines:  Limit alcohol intake to no more than 1 drink per day for nonpregnant women and 2 drinks per day for men. One drink equals 12 oz of beer, 5 oz of wine, or 1 oz of hard liquor.  Do not drink on an empty stomach.  Keep yourself hydrated with water, diet soda, or unsweetened iced tea.  Keep in mind that regular soda, juice, and other mixers may contain a lot of sugar and must be counted as carbohydrates.  What are tips for following this plan? Reading food labels  Start by checking the serving size on the label. The amount of calories, carbohydrates, fats, and other nutrients listed on the label are based on one serving of the food. Many foods contain more than one serving per package.  Check the total grams (g) of carbohydrates in one serving. You can calculate the number of servings of carbohydrates in one serving by dividing the total carbohydrates by 15. For example, if a food has 30 g of total carbohydrates, it would be equal to 2 servings of carbohydrates.  Check the number of grams (g) of saturated and trans fats in one serving. Choose foods that have low or no amount of these fats.  Check the number of milligrams (mg) of sodium in one serving. Most people   should limit total sodium intake to less than 2,300 mg per day.  Always check the nutrition information of foods labeled as "low-fat" or "nonfat". These foods may be higher in added sugar or refined carbohydrates and should be avoided.  Talk to your dietitian to identify your daily goals for nutrients listed on the label. Shopping  Avoid buying canned, premade, or processed foods. These  foods tend to be high in fat, sodium, and added sugar.  Shop around the outside edge of the grocery store. This includes fresh fruits and vegetables, bulk grains, fresh meats, and fresh dairy. Cooking  Use low-heat cooking methods, such as baking, instead of high-heat cooking methods like deep frying.  Cook using healthy oils, such as olive, canola, or sunflower oil.  Avoid cooking with butter, cream, or high-fat meats. Meal planning  Eat meals and snacks regularly, preferably at the same times every day. Avoid going long periods of time without eating.  Eat foods high in fiber, such as fresh fruits, vegetables, beans, and whole grains. Talk to your dietitian about how many servings of carbohydrates you can eat at each meal.  Eat 4-6 ounces of lean protein each day, such as lean meat, chicken, fish, eggs, or tofu. 1 ounce is equal to 1 ounce of meat, chicken, or fish, 1 egg, or 1/4 cup of tofu.  Eat some foods each day that contain healthy fats, such as avocado, nuts, seeds, and fish. Lifestyle   Check your blood glucose regularly.  Exercise at least 30 minutes 5 or more days each week, or as told by your health care provider.  Take medicines as told by your health care provider.  Do not use any products that contain nicotine or tobacco, such as cigarettes and e-cigarettes. If you need help quitting, ask your health care provider.  Work with a Social worker or diabetes educator to identify strategies to manage stress and any emotional and social challenges. What are some questions to ask my health care provider?  Do I need to meet with a diabetes educator?  Do I need to meet with a dietitian?  What number can I call if I have questions?  When are the best times to check my blood glucose? Where to find more information:  American Diabetes Association: diabetes.org/food-and-fitness/food  Academy of Nutrition and Dietetics:  PokerClues.dk  [employer] of Diabetes and Digestive and Kidney Diseases (NIH): ContactWire.be Summary  A healthy meal plan will help you control your blood glucose and maintain a healthy lifestyle.  Working with a diet and nutrition specialist (dietitian) can help you make a meal plan that is best for you.  Keep in mind that carbohydrates and alcohol have immediate effects on your blood glucose levels. It is important to count carbohydrates and to use alcohol carefully. This information is not intended to replace advice given to you by your health care provider. Make sure you discuss any questions you have with your health care provider. Document Released: 09/30/2004 Document Revised: 02/08/2016 Document Reviewed: 02/08/2016 Elsevier Interactive Patient Education  2018 [employer].    Diabetes Mellitus and Standards of Medical Care Managing diabetes (diabetes mellitus) can be complicated. Your diabetes treatment may be managed by a team of health care providers, including:  A diet and nutrition specialist (registered dietitian).  An eye doctor.  A primary care provider.  A dentist.  Your health care providers follow a schedule in order to help you get the best quality of care. The following schedule is a general guideline  for your diabetes management plan. Your health care providers may also give you more specific instructions. HbA1c (hemoglobin A1c) test This test provides information about blood sugar (glucose) control over the previous 2-3 months. It is used to check whether your diabetes management plan needs to be adjusted.  If you are meeting your treatment goals, this test is done at least 2 times a year.  If you are not meeting treatment goals or if your treatment goals have changed, this test is done 4 times a year.  Blood pressure  test  This test is done at every routine medical visit. For most people, the goal is less than 130/80. Ask your health care provider what your goal blood pressure should be. Dental and eye exams  Visit your dentist two times a year.  If you have type 1 diabetes, get an eye exam 3-5 years after you are diagnosed, and then once a year after your first exam. ? If you were diagnosed with type 1 diabetes as a child, get an eye exam when you are age 73 or older and have had diabetes for 3-5 years. After the first exam, you should get an eye exam once a year.  If you have type 2 diabetes, have an eye exam as soon as you are diagnosed, and then once a year after your first exam. Foot care exam  Visual foot exams are done at every routine medical visit. The exams check for cuts, bruises, redness, blisters, sores, or other problems with the feet.  A complete foot exam is done by your health care provider once a year. This exam includes an inspection of the structure and skin of your feet, and a check of the pulses and sensation in your feet. ? Type 1 diabetes: Get your first exam 3-5 years after diagnosis. ? Type 2 diabetes: Get your first exam as soon as you are diagnosed.  Check your feet every day for cuts, bruises, redness, blisters, or sores. If you have any of these or other problems that are not healing, contact your health care provider. Kidney function test (urine microalbumin)  This test is done once a year. ? Type 1 diabetes: Get your first test 5 years after diagnosis. ? Type 2 diabetes: Get your first test as soon as you are diagnosed.  If you have chronic kidney disease (CKD), get a serum creatinine and estimated glomerular filtration rate (eGFR) test once a year. Lipid profile (cholesterol, HDL, LDL, triglycerides)  This test should be done when you are diagnosed with diabetes, and every 5 years after the first test. If you are on medicines to lower your cholesterol, you may need to  get this test done every year. ? The goal for LDL is less than 100 mg/dL (5.5 mmol/L). If you are at high risk, the goal is less than 70 mg/dL (3.9 mmol/L). ? The goal for HDL is 40 mg/dL (2.2 mmol/L) for men and 50 mg/dL(2.8 mmol/L) for women. An HDL cholesterol of 60 mg/dL (3.3 mmol/L) or higher gives some protection against heart disease. ? The goal for triglycerides is less than 150 mg/dL (8.3 mmol/L). Immunizations  The yearly flu (influenza) vaccine is recommended for everyone 6 months or older who has diabetes.  The pneumonia (pneumococcal) vaccine is recommended for everyone 2 years or older who has diabetes. If you are 16 or older, you may get the pneumonia vaccine as a series of two separate shots.  The Tdap (tetanus, diphtheria, and pertussis) vaccine should be  given: ? According to normal childhood vaccination schedules, for children. ? Every 10 years, for adults who have diabetes.  The shingles vaccine is recommended for people who are 50 years or older. Mental and emotional health  Screening for symptoms of eating disorders, anxiety, and depression is recommended at the time of diagnosis and afterward as needed. If your screening shows that you have symptoms (you have a positive screening result), you may need further evaluation and be referred to a mental health care provider. Diabetes self-management education  Education about how to manage your diabetes is recommended at diagnosis and ongoing as needed. Treatment plan  Your treatment plan will be reviewed at every medical visit. Summary  Managing diabetes (diabetes mellitus) can be complicated. Your diabetes treatment may be managed by a team of health care providers.  Your health care providers follow a schedule in order to help you get the best quality of care.  Standards of care including having regular physical exams, blood tests, blood pressure monitoring, immunizations, screening tests, and education about how to  manage your diabetes.  Your health care providers may also give you more specific instructions based on your individual health. This information is not intended to replace advice given to you by your health care provider. Make sure you discuss any questions you have with your health care provider. Document Released: 10/31/2008 Document Revised: 10/02/2015 Document Reviewed: 10/02/2015 Elsevier Interactive Patient Education  [employer].

## 2017-06-14 NOTE — Progress Notes (Signed)
HPI: Brianna Walton is a 57 y.o. female who  has a past medical history of At high risk for altered glucose metabolism, Hyperlipidemia, and Hypertension.  she presents to [facility] today, 06/14/17,  for chief complaint of:  Prediabetes A1C due  Diabetes: 2 A1C in DM2 range DIABETES SCREENING/PREVENTIVE CARE: A1C past 3-6 mos: Yes   6.7% on 02/08/17  6.8% today 06/14/17 --> discussed increase metformin , should have been on bid previously but was only on daily  BP goal <130/80: no LDL goal <70: No - 126 in 01/2017 Eye exam annually: needs, importance discussed with patient Foot exam: No  Microalbuminuria: n/a Metformin: Yes  ACE/ARB: Yes  Antiplatelet if ASCVD Risk >10%: No  Statin: Yes  Pneumovax: needs   HTN: Last visit we discussed changing form Clonidine to Bystolic - she tried this a bit but states BP went up, she is also on Spironolactone 35 mg and Amlodpine-Benazepril 10-40 mg w/ intolerance to other classes of drugs thiazides and BB.  Palpitations: New concern, feels mostly in the mornings, occasional thumping feeling without dizziness, shortness of breath, persistent chest pain. Resolves on its own in a few moments      Past medical history, surgical history, and family history reviewed.  Current medication list and allergy/intolerance information reviewed.   (See remainder of HPI, ROS, Phys Exam below)   EKG: See scanned documents. No concerns.    ASSESSMENT/PLAN:   Type 2 diabetes mellitus with other specified complication, without long-term current use of insulin (HCC) - to buckle A1c, diagnosis of diabetes at this point. Advised definitely increased metformin and diet/exercise modifications - Plan: POCT HgB A1C  Heart palpitations - advised if labs normal, consider Holter monitor and echocardiogram, ounds to me like likely PVC - Plan: CBC, COMPLETE METABOLIC PANEL WITH GFR, TSH, Magnesium, EKG 12-Lead  Essential  hypertension, benign - Plan: amLODipine-benazepril (LOTREL) 10-40 MG capsule  Dyslipidemia   Meds ordered this encounter  Medications  . amLODipine-benazepril (LOTREL) 10-40 MG capsule    Sig: Take 1 capsule by mouth daily. Pt needs f/u appt w/PCP for refills.    Dispense:  90 capsule    Refill:  3      Follow-up plan: Return in about 3 months (around 09/14/2017) for recheck Diabetes / A1C - see me sooner if needed.     ############################################ ############################################ ############################################ ############################################    Outpatient Encounter Medications as of 06/14/2017  Medication Sig  . amLODipine-benazepril (LOTREL) 10-40 MG capsule Take 1 capsule by mouth daily. Pt needs f/u appt w/PCP for refills.  Marland Kitchen atorvastatin (LIPITOR) 20 MG tablet Take 1 tablet (20 mg total) by mouth daily.  . cloNIDine (CATAPRES) 0.2 MG tablet Take 1 tablet (0.2 mg total) by mouth 2 (two) times daily.  . metFORMIN (GLUCOPHAGE XR) 500 MG 24 hr tablet Take 1 tablet (500 mg total) by mouth 2 (two) times daily.  Marland Kitchen spironolactone (ALDACTONE) 25 MG tablet TAKE 1 TABLET (25 MG TOTAL) BY MOUTH DAILY.  Marland Kitchen albuterol (PROVENTIL HFA;VENTOLIN HFA) 108 (90 Base) MCG/ACT inhaler Inhale 2 puffs into the lungs every 6 (six) hours as needed for wheezing or shortness of breath. (Patient not taking: Reported on 02/08/2017)  . ipratropium (ATROVENT) 0.06 % nasal spray Place 2 sprays into both nostrils 4 (four) times daily. (Patient not taking: Reported on 06/14/2017)  . predniSONE (DELTASONE) 10 MG tablet Take 3 tablets (30 mg total) by mouth daily with breakfast. (Patient not taking: Reported on 02/08/2017)  No facility-administered encounter medications on file as of 06/14/2017.    Allergies  Allergen Reactions  . Penicillins Rash  . Atenolol     Achy.   . Labetalol   . Hydrochlorothiazide Rash    Achy all over/Gout       Review of  Systems:  Constitutional: No recent illness  HEENT: No  headache, no vision change  Cardiac: No  chest pain, No  pressure, +palpitations  Respiratory:  No  shortness of breath. No  Cough  Gastrointestinal: No  abdominal pain, no change on bowel habits  Musculoskeletal: No new myalgia/arthralgia  Neurologic: No  weakness, No  Dizziness  Psychiatric: No  concerns with depression, No  concerns with anxiety  Exam:  BP 138/90 (BP Location: Left Arm, Patient Position: Sitting, Cuff Size: Large)   Pulse 98   Temp 98.4 F (36.9 C) (Oral)   Wt 211 lb 6.4 oz (95.9 kg)   BMI 36.29 kg/m   Constitutional: VS see above. General Appearance: alert, well-developed, well-nourished, NAD  Eyes: Normal lids and conjunctive, non-icteric sclera  Ears, Nose, Mouth, Throat: MMM, Normal external inspection ears/nares/mouth/lips/gums.  Neck: No masses, trachea midline.   Respiratory: Normal respiratory effort. no wheeze, no rhonchi, no rales  Cardiovascular: S1/S2 normal, no murmur, no rub/gallop auscultated. RRR.   Musculoskeletal: Gait normal. Symmetric and independent movement of all extremities  Neurological: Normal balance/coordination. No tremor.  Skin: warm, dry, intact.   Psychiatric: Normal judgment/insight. Normal mood and affect. Oriented x3.   Visit summary with medication list and pertinent instructions was printed for patient to review, advised to alert [place] if any changes needed. All questions at time of visit were answered - patient instructed to contact office with any additional concerns. ER/RTC precautions were reviewed with the patient and understanding verbalized.   Follow-up plan: Return in about 3 months (around 09/14/2017) for recheck Diabetes / A1C - see me sooner if needed.    Please note: voice recognition software was used to produce this document, and typos may escape review. Please contact Dr. Sheppard Coil for any needed clarifications.

## 2017-06-15 LAB — COMPLETE METABOLIC PANEL WITH GFR
AG Ratio: 1.4 (calc) (ref 1.0–2.5)
ALBUMIN MSPROF: 4.3 g/dL (ref 3.6–5.1)
ALKALINE PHOSPHATASE (APISO): 102 U/L (ref 33–130)
ALT: 19 U/L (ref 6–29)
AST: 21 U/L (ref 10–35)
BILIRUBIN TOTAL: 0.6 mg/dL (ref 0.2–1.2)
BUN / CREAT RATIO: 16 (calc) (ref 6–22)
BUN: 18 mg/dL (ref 7–25)
CHLORIDE: 103 mmol/L (ref 98–110)
CO2: 28 mmol/L (ref 20–32)
Calcium: 9.7 mg/dL (ref 8.6–10.4)
Creat: 1.15 mg/dL — ABNORMAL HIGH (ref 0.50–1.05)
GFR, Est African American: 61 mL/min/{1.73_m2} (ref >=60)
GFR, Est Non African American: 53 mL/min/{1.73_m2} — ABNORMAL LOW (ref >=60)
GLUCOSE: 107 mg/dL — AB (ref 65–99)
Globulin: 3.1 g/dL (calc) (ref 1.9–3.7)
Potassium: 3.6 mmol/L (ref 3.5–5.3)
SODIUM: 140 mmol/L (ref 135–146)
Total Protein: 7.4 g/dL (ref 6.1–8.1)

## 2017-06-15 LAB — CBC
HCT: 42.2 % (ref 35.0–45.0)
Hemoglobin: 13.9 g/dL (ref 11.7–15.5)
MCH: 24 pg — ABNORMAL LOW (ref 27.0–33.0)
MCHC: 32.9 g/dL (ref 32.0–36.0)
MCV: 73 fL — ABNORMAL LOW (ref 80.0–100.0)
MPV: 12.4 fL (ref 7.5–12.5)
PLATELETS: 429 10*3/uL — AB (ref 140–400)
RBC: 5.78 10*6/uL — ABNORMAL HIGH (ref 3.80–5.10)
RDW: 14.7 % (ref 11.0–15.0)
WBC: 10.1 10*3/uL (ref 3.8–10.8)

## 2017-06-15 LAB — MAGNESIUM: Magnesium: 1.9 mg/dL (ref 1.5–2.5)

## 2017-06-15 LAB — TSH: TSH: 2.29 mIU/L (ref 0.40–4.50)

## 2017-06-16 NOTE — Addendum Note (Signed)
Addended by: Maryla Morrow on: 06/16/2017 02:10 PM   Modules accepted: Orders

## 2017-07-10 ENCOUNTER — Other Ambulatory Visit: Payer: Self-pay | Admitting: Osteopathic Medicine

## 2017-07-10 DIAGNOSIS — I10 Essential (primary) hypertension: Secondary | ICD-10-CM

## 2017-07-24 ENCOUNTER — Other Ambulatory Visit: Payer: Self-pay | Admitting: Sports Medicine

## 2017-07-31 ENCOUNTER — Emergency Department (INDEPENDENT_AMBULATORY_CARE_PROVIDER_SITE_OTHER)
Admission: EM | Admit: 2017-07-31 | Discharge: 2017-07-31 | Disposition: A | Discharge status: Home / Self Care | Payer: Federal, State, Local not specified - PPO | Source: Home / Self Care | Attending: Family Medicine | Admitting: Family Medicine

## 2017-07-31 ENCOUNTER — Emergency Department (INDEPENDENT_AMBULATORY_CARE_PROVIDER_SITE_OTHER): Payer: Federal, State, Local not specified - PPO

## 2017-07-31 ENCOUNTER — Other Ambulatory Visit: Payer: Self-pay

## 2017-07-31 DIAGNOSIS — M25562 Pain in left knee: Secondary | ICD-10-CM | POA: Diagnosis not present

## 2017-07-31 NOTE — ED Triage Notes (Signed)
Pt was flushing the toilet with her foot about a week ago and felt a pop in left knee.

## 2017-07-31 NOTE — Discharge Instructions (Addendum)
Wear knee brace daytime.  May take Aleve, 2 tabs every 12 hours.

## 2017-07-31 NOTE — ED Provider Notes (Signed)
[facility]    CSN: [id number] Arrival date & time: 07/31/17  1029     History   Chief Complaint Chief Complaint  Patient presents with  . Knee Pain    left    HPI Brianna Walton is a 57 y.o. female.   Patient reports that she used her left foot to flush a toilet about 1.5 weeks ago.  She felt a sudden painful pop in her knee, and continues to have left knee pain with weight bearing.  The history is provided by the patient.  Knee Pain  Location:  Knee Time since incident:  10 days Injury: yes   Knee location:  L knee Pain details:    Quality:  Aching   Radiates to:  Does not radiate   Severity:  Moderate   Onset quality:  Sudden   Duration:  10 days   Timing:  Constant   Progression:  Unchanged Chronicity:  New Dislocation: no   Prior injury to area:  No Relieved by:  Nothing Worsened by:  Bearing weight Ineffective treatments:  Ice Associated symptoms: stiffness   Associated symptoms: no decreased ROM, no muscle weakness, no numbness, no swelling and no tingling   Risk factors: obesity     Past Medical History:  Diagnosis Date  . At high risk for altered glucose metabolism   . Hyperlipidemia   . Hypertension     Patient Active Problem List   Diagnosis Date Noted  . Type 2 diabetes mellitus ([place]) 06/14/2017  . Neck pain on left side 07/08/2016  . Headache 07/08/2016  . 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 Proteinuria 09/10/2014  . Vitamin D deficiency 09/10/2014  . 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 Microalbuminuria 06/09/2014  . Lumbar radiculitis 08/08/2013  . Obesity, unspecified 05/08/2013  . Essential hypertension, benign 05/08/2013  . Dyslipidemia 05/08/2013  . Personal history of colonic polyps 05/07/2013    Past Surgical History:  Procedure Laterality Date  . ABDOMINAL HYSTERECTOMY    . CHOLECYSTECTOMY      OB History   None      Home Medications      Prior to Admission medications   Medication Sig Start Date End Date Taking? Authorizing Provider  amLODipine-benazepril (LOTREL) 10-40 MG capsule Take 1 capsule by mouth daily. Pt needs f/u appt w/PCP for refills. 06/14/17   Emeterio Reeve, DO  atorvastatin (LIPITOR) 20 MG tablet Take 1 tablet (20 mg total) by mouth daily. 02/13/17   Emeterio Reeve, DO  cloNIDine (CATAPRES) 0.2 MG tablet Take 1 tablet (0.2 mg total) by mouth 2 (two) times daily. 02/22/17   Emeterio Reeve, DO  meloxicam (MOBIC) 15 MG tablet TAKE 1 TABLET BY MOUTH EVERY DAY 07/25/17   Silverio Decamp, MD  metFORMIN (GLUCOPHAGE XR) 500 MG 24 hr tablet Take 1 tablet (500 mg total) by mouth 2 (two) times daily. 02/08/17 02/08/18  Emeterio Reeve, DO  spironolactone (ALDACTONE) 25 MG tablet TAKE 1 TABLET (25 MG TOTAL) BY MOUTH DAILY. 11/01/16   Emeterio Reeve, DO    Family History Family History  Problem Relation Age of Onset  . Diabetes Mother   . Hypertension Mother   . Kidney disease Mother   . Hypertension Father     Social History Social History   Tobacco Use  . Smoking status: Never Smoker  . Smokeless tobacco: Never Used  Substance Use Topics  . Alcohol use: No  . Drug use: No  Allergies   Penicillins; Atenolol; Labetalol; and Hydrochlorothiazide   Review of Systems Review of Systems  Musculoskeletal: Positive for stiffness.  All other systems reviewed and are negative.    Physical Exam Triage Vital Signs ED Triage Vitals  Enc Vitals Group     BP 07/31/17 1103 123/87     Pulse Rate 07/31/17 1103 92     Resp --      Temp 07/31/17 1103 98.3 F (36.8 C)     Temp Source 07/31/17 1103 Oral     SpO2 07/31/17 1103 99 %     Weight 07/31/17 1104 215 lb (97.5 kg)     Height 07/31/17 1104 5\' 4"  (1.626 m)     Head Circumference --      Peak Flow --      Pain Score 07/31/17 1103 8     Pain Loc --      Pain Edu? --      Excl. in [place]? --    No data found.  Updated Vital  Signs BP 123/87 (BP Location: Right Arm)   Pulse 92   Temp 98.3 F (36.8 C) (Oral)   Ht 5\' 4"  (1.626 m)   Wt 215 lb (97.5 kg)   SpO2 99%   BMI 36.90 kg/m   Visual Acuity Right Eye Distance:   Left Eye Distance:   Bilateral Distance:    Right Eye Near:   Left Eye Near:    Bilateral Near:     Physical Exam  Constitutional: She appears well-developed and well-nourished. No distress.  HENT:  Head: Atraumatic.  Eyes: Pupils are equal, round, and reactive to light.  Cardiovascular: Normal rate.  Pulmonary/Chest: Effort normal.  Musculoskeletal:       Left knee: She exhibits normal range of motion, no swelling, no effusion, no ecchymosis, no erythema, normal alignment and no LCL laxity. Tenderness found. Medial joint line tenderness noted. No patellar tendon tenderness noted.       Legs: Left knee:  No effusion, erythema, or warmth.  Knee stable, negative drawer test.  McMurray test negative.  Mild tenderness to palpation medial joint line.  Neurological: She is alert.  Skin: Skin is warm and dry.  Vitals reviewed.    UC Treatments / Results  Labs (all labs ordered are listed, but only abnormal results are displayed) Labs Reviewed - No data to display  EKG None  Radiology Dg Knee Complete 4 Views Left  Result Date: 07/31/2017 CLINICAL DATA:  Left knee pain, twisting injury 1-2 weeks ago. EXAM: LEFT KNEE - COMPLETE 4+ VIEW COMPARISON:  04/08/2014 FINDINGS: Spurring in the patellofemoral compartment. No joint effusion. No acute bony abnormality. Specifically, no fracture, subluxation, or dislocation. IMPRESSION: No acute bony abnormality. Electronically Signed   By: Rolm Baptise M.D.   On: 07/31/2017 12:06    Procedures Procedures (including critical care time)  Medications Ordered in UC Medications - No data to display  Initial Impression / Assessment and Plan / UC Course  I have reviewed the triage vital signs and the nursing notes.  Pertinent labs & imaging  results that were available during my care of the patient were reviewed by me and considered in my medical decision making (see chart for details).    Dispensed hinged knee brace. Followup with Dr. Aundria Mems or Dr. Lynne Leader ([facility]) if not improving about two weeks.    Final Clinical Impressions(s) / UC Diagnoses   Final diagnoses:  Acute pain of left knee  Discharge Instructions     Wear knee brace daytime.  May take Aleve, 2 tabs every 12 hours.    ED Prescriptions    None        Kandra Nicolas, MD 08/04/17 1300

## 2017-08-01 ENCOUNTER — Ambulatory Visit: Payer: Federal, State, Local not specified - PPO | Admitting: Family Medicine

## 2017-08-17 ENCOUNTER — Other Ambulatory Visit: Payer: Self-pay | Admitting: Osteopathic Medicine

## 2017-08-17 DIAGNOSIS — I10 Essential (primary) hypertension: Secondary | ICD-10-CM

## 2017-08-23 ENCOUNTER — Encounter: Payer: Self-pay | Admitting: Osteopathic Medicine

## 2017-08-23 ENCOUNTER — Ambulatory Visit (INDEPENDENT_AMBULATORY_CARE_PROVIDER_SITE_OTHER): Payer: Federal, State, Local not specified - PPO | Admitting: Osteopathic Medicine

## 2017-08-23 VITALS — BP 138/86 | HR 87 | Temp 98.5°F | Wt 219.2 lb

## 2017-08-23 DIAGNOSIS — G8929 Other chronic pain: Secondary | ICD-10-CM | POA: Diagnosis not present

## 2017-08-23 DIAGNOSIS — M25462 Effusion, left knee: Secondary | ICD-10-CM

## 2017-08-23 DIAGNOSIS — M25562 Pain in left knee: Secondary | ICD-10-CM | POA: Diagnosis not present

## 2017-08-23 NOTE — Progress Notes (Signed)
HPI: Brianna Walton is a 57 y.o. female who  has a past medical history of At high risk for altered glucose metabolism, Hyperlipidemia, and Hypertension.  she presents to [facility] today, 08/23/17,  for chief complaint of:  Knee pain   . Felt a pop in the L knee when she used her L foot to flush a toilet around the beginning of July (about 1 month ago). Worse w/ weight bearing. Has tried ice and OTC treatments.  . Saw urgent care 07/31/17: tender at medial L knee joint line, no effusion. Stable ligamentous exam. XR (+)patelofemoral spurring, no effusion or other abnormality. Advised knee brace, aleve 2 tabs q 12 h.  . Knee pain persists today, feels like swelling.     Past medical history, surgical history, and family history reviewed.  Current medication list and allergy/intolerance information reviewed.   (See remainder of HPI, ROS, Phys Exam below)    ASSESSMENT/PLAN:   Chronic pain of left knee - Plan: Body fluid culture, Synovial cell count + diff, w/ crystals, Gram stain  Effusion of left knee - Plan: Body fluid culture, Synovial cell count + diff, w/ crystals, Gram stain    Procedure note: L knee aspiration/injection After identifying landmarks at medial knee and marking the kin, the area was cleaned in sterile fashion. Cold spray applied. 18G needle w/ 60cc syring inserted into joint capsule and approx 10cc clear fluid aspirated. Needle left in place and syringes exchanges for 1 mL kenalog + 2 mL lidocaine + 2 mL bupivicaine and this was injected into knee and needle withdrawn. Site was bandaged. Patient tolerated procedure well.   Recent Results (from the past 2160 hour(s))  POCT HgB A1C     Status: Abnormal   Collection Time: 06/14/17 11:09 AM  Result Value Ref Range   Hemoglobin A1C 6.8 (A) 4.0 - 5.6 %   HbA1c, POC (prediabetic range)  5.7 - 6.4 %   HbA1c, POC (controlled diabetic range)  0.0 - 7.0 %  CBC     Status: Abnormal    Collection Time: 06/14/17 12:02 PM  Result Value Ref Range   WBC 10.1 3.8 - 10.8 Thousand/uL   RBC 5.78 (H) 3.80 - 5.10 Million/uL   Hemoglobin 13.9 11.7 - 15.5 g/dL   HCT 42.2 35.0 - 45.0 %   MCV 73.0 (L) 80.0 - 100.0 fL   MCH 24.0 (L) 27.0 - 33.0 pg   MCHC 32.9 32.0 - 36.0 g/dL   RDW 14.7 11.0 - 15.0 %   Platelets 429 (H) 140 - 400 Thousand/uL   MPV 12.4 7.5 - 12.5 fL  COMPLETE METABOLIC PANEL WITH GFR     Status: Abnormal   Collection Time: 06/14/17 12:02 PM  Result Value Ref Range   Glucose, Bld 107 (H) 65 - 99 mg/dL    Comment: .            Fasting reference interval . For someone without known diabetes, a glucose value between 100 and 125 mg/dL is consistent with prediabetes and should be confirmed with a follow-up test. .    BUN 18 7 - 25 mg/dL   Creat 1.15 (H) 0.50 - 1.05 mg/dL    Comment: For patients >36 years of age, the reference limit for Creatinine is approximately 13% higher for people identified as African-American. .    GFR, Est Non African American 53 (L) > OR = 60 mL/min/1.21m2   GFR, Est African American 61 > OR = 60  mL/min/1.85m2   BUN/Creatinine Ratio 16 6 - 22 (calc)   Sodium 140 135 - 146 mmol/L   Potassium 3.6 3.5 - 5.3 mmol/L   Chloride 103 98 - 110 mmol/L   CO2 28 20 - 32 mmol/L   Calcium 9.7 8.6 - 10.4 mg/dL   Total Protein 7.4 6.1 - 8.1 g/dL   Albumin 4.3 3.6 - 5.1 g/dL   Globulin 3.1 1.9 - 3.7 g/dL (calc)   AG Ratio 1.4 1.0 - 2.5 (calc)   Total Bilirubin 0.6 0.2 - 1.2 mg/dL   Alkaline phosphatase (APISO) 102 33 - 130 U/L   AST 21 10 - 35 U/L   ALT 19 6 - 29 U/L  TSH     Status: None   Collection Time: 06/14/17 12:02 PM  Result Value Ref Range   TSH 2.29 0.40 - 4.50 mIU/L  Magnesium     Status: None   Collection Time: 06/14/17 12:02 PM  Result Value Ref Range   Magnesium 1.9 1.5 - 2.5 mg/dL  Synovial cell count + diff, w/ crystals     Status: Abnormal   Collection Time: 08/23/17  3:59 PM  Result Value Ref Range   Site NOT GIVEN     Color, Synovial YELLOW STRAW/YELL   Appearance-Synovial CLEAR CLEAR/HAZY   WBC, Synovial 255 (H) <150 cells/uL   Neutrophil, Synovial 13 0 - 24 %   Lymphocytes-Synovial Fld 48 0 - 74 %   Monocyte/Macrophage 39 0 - 69 %   Eosinophils-Synovial 0 0 - 2 %   Basophils, % 0 0 %   Synoviocytes, % 0 0 - 15 %   Crystals  NONE SEEN /HPF    Comment: No crystals found   Mucin Clot  GOOD    Comment: . Fair clot Reduced viscosity .   TIQ-NTM     Status: None   Collection Time: 08/23/17  3:59 PM  Result Value Ref Range   QUESTION/PROBLEM:      Comment: . No test(s) are indicated on the requisition for the following specimen(s). .    SPECIMEN(S) RECEIVED: RF     Comment: REQUESTED INFORMATION _________________________________ . AUTHORIZED SIGNATURE __________________________________ . TO PREVENT FURTHER DELAYS IN TESTING, PLEASE COMPLETE INFORMATION ABOVE AND FAX TO [phone] TO RESOLVE  THIS ORDER.   TEST AUTHORIZATION     Status: None   Collection Time: 08/23/17  3:59 PM  Result Value Ref Range   TEST NAME: CULTURE, AEROBIC AND ANAEROBI    TEST CODE: [id number]    CLIENT CONTACT: KELSEY RICHARDSON    REPORT ALWAYS MESSAGE SIGNATURE      Comment: . The laboratory testing on this patient was verbally requested or confirmed by the ordering physician or his or her authorized representative after contact with an employee of [employer]. Federal regulations require that we maintain on file written authorization for all laboratory testing.  Accordingly we are asking that the ordering physician or his or her authorized representative sign a copy of this report and promptly return it to the client service representative. . . Signature:____________________________________________________ . Please fax this signed page to [phone] or return it via your [employer] courier.   Anaerobic and Aerobic Culture     Status: None (Preliminary result)   Collection  Time: 08/23/17  3:59 PM  Result Value Ref Range   MICRO NUMBER: [id number]    SPECIMEN QUALITY: ADEQUATE    Source: L KNEE FLUID    STATUS: PRELIMINARY    GRAM STAIN: No white  blood cells seen No organisms seen    ANA RESULT:      No anaerobes isolated to date, continuing incubation.   MICRO NUMBER: [id number]    SPECIMEN QUALITY: ADEQUATE    SOURCE: L KNEE FLUID    STATUS: PRELIMINARY    AER RESULT: No growth to date       Follow-up plan: Return for recheck w/ Dr T if not better by end of this week .     ############################################ ############################################ ############################################ ############################################    Outpatient Encounter Medications as of 08/23/2017  Medication Sig  . amLODipine-benazepril (LOTREL) 10-40 MG capsule Take 1 capsule by mouth daily. Pt needs f/u appt w/PCP for refills.  Marland Kitchen atorvastatin (LIPITOR) 20 MG tablet Take 1 tablet (20 mg total) by mouth daily.  . cloNIDine (CATAPRES) 0.2 MG tablet Take 1 tablet (0.2 mg total) by mouth 2 (two) times daily.  . meloxicam (MOBIC) 15 MG tablet TAKE 1 TABLET BY MOUTH EVERY DAY  . metFORMIN (GLUCOPHAGE XR) 500 MG 24 hr tablet Take 1 tablet (500 mg total) by mouth 2 (two) times daily.  Marland Kitchen spironolactone (ALDACTONE) 25 MG tablet TAKE 1 TABLET BY MOUTH EVERY DAY   No facility-administered encounter medications on file as of 08/23/2017.    Allergies  Allergen Reactions  . Penicillins Rash  . Atenolol     Achy.   . Labetalol   . Hydrochlorothiazide Rash    Achy all over/Gout       Review of Systems:  Constitutional: No recent illness  Gastrointestinal: No  abdominal pain  Musculoskeletal: + myalgia/arthralgia as per HPI  Skin: No  Rash  Neurologic: No  weakness, No  Dizziness   Exam:  BP 138/86 (BP Location: Left Arm, Patient Position: Sitting, Cuff Size: Large)   Pulse 87   Temp 98.5 F (36.9 C) (Oral)   Wt 219 lb 3.2 oz (99.4  kg)   BMI 37.63 kg/m   Constitutional: VS see above. General Appearance: alert, well-developed, well-nourished, NAD  Eyes: Normal lids and conjunctive, non-icteric sclera  Ears, Nose, Mouth, Throat: MMM, Normal external inspection ears/nares/mouth/lips/gums.  Neck: No masses, trachea midline.   Respiratory: Normal respiratory effort.   Musculoskeletal: (+)effusion and crepitus on L knee. (+)mcmurray's medially. See procedure note.   Neurological: Normal balance/coordination. No tremor.  Skin: warm, dry, intact.   Psychiatric: Normal judgment/insight. Normal mood and affect. Oriented x3.   Visit summary with medication list and pertinent instructions was printed for patient to review, advised to alert [place] if any changes needed. All questions at time of visit were answered - patient instructed to contact office with any additional concerns. ER/RTC precautions were reviewed with the patient and understanding verbalized.   Follow-up plan: Return for recheck w/ Dr T if not better by end of this week .    Please note: voice recognition software was used to produce this document, and typos may escape review. Please contact Dr. Sheppard Coil for any needed clarifications.

## 2017-08-24 LAB — TIQ-NTM

## 2017-08-29 ENCOUNTER — Telehealth: Payer: Self-pay

## 2017-08-29 NOTE — Telephone Encounter (Signed)
Error. -EH/RMA

## 2017-08-30 LAB — SYNOVIAL CELL COUNT + DIFF, W/ CRYSTALS
Basophils, %: 0 %
EOSINOPHILS-SYNOVIAL: 0 % (ref 0–2)
Lymphocytes-Synovial Fld: 48 % (ref 0–74)
MONOCYTE/MACROPHAGE: 39 % (ref 0–69)
NEUTROPHIL, SYNOVIAL: 13 % (ref 0–24)
Synoviocytes, %: 0 % (ref 0–15)
WBC, SYNOVIAL: 255 {cells}/uL — AB (ref <150)

## 2017-08-30 LAB — ANAEROBIC AND AEROBIC CULTURE
AER RESULT:: NO GROWTH
MICRO NUMBER:: 90940119
MICRO NUMBER:: 90940120
SPECIMEN QUALITY: ADEQUATE
SPECIMEN QUALITY:: ADEQUATE

## 2017-08-30 LAB — TEST AUTHORIZATION

## 2017-08-31 ENCOUNTER — Telehealth: Payer: Self-pay

## 2017-08-31 NOTE — Telephone Encounter (Signed)
Left a detailed vm msg for pt regarding lab results & provider's recommendation. Call back information provided.

## 2017-08-31 NOTE — Telephone Encounter (Signed)
Pt called requesting her results from fluid drawn from her Left knee. She continues to have swelling. Pls advise. Thanks.

## 2017-08-31 NOTE — Telephone Encounter (Signed)
Needs to see Dr T if still bothering her! No infection or other serious issue noted on the fluid testing.

## 2017-09-14 ENCOUNTER — Ambulatory Visit: Payer: Federal, State, Local not specified - PPO | Admitting: Osteopathic Medicine

## 2017-10-12 ENCOUNTER — Encounter: Payer: Self-pay | Admitting: Sports Medicine

## 2017-10-12 ENCOUNTER — Ambulatory Visit (INDEPENDENT_AMBULATORY_CARE_PROVIDER_SITE_OTHER): Payer: Federal, State, Local not specified - PPO | Admitting: Sports Medicine

## 2017-10-12 DIAGNOSIS — M1712 Unilateral primary osteoarthritis, left knee: Secondary | ICD-10-CM | POA: Insufficient documentation

## 2017-10-12 DIAGNOSIS — M25562 Pain in left knee: Secondary | ICD-10-CM

## 2017-10-12 NOTE — Progress Notes (Signed)
Subjective:    I'm seeing this patient as a consultation for: Dr. Emeterio Reeve  CC: Left knee pain  HPI: This is a pleasant 57 year old female, for a couple of months after trying to kick the lever on a public toilet she is had pain that she localizes along the medial joint line of her left knee, significant swelling, pain with terminal flexion.  She had a single aspiration and injection with only minimal relief.  Recurrence of pain, mechanical symptoms.  Localized without radiation.  I reviewed the past medical history, family history, social history, surgical history, and allergies today and no changes were needed.  Please see the problem list section below in epic for further details.  Past Medical History: Past Medical History:  Diagnosis Date  . At high risk for altered glucose metabolism   . Hyperlipidemia   . Hypertension    Past Surgical History: Past Surgical History:  Procedure Laterality Date  . ABDOMINAL HYSTERECTOMY    . CHOLECYSTECTOMY     Social History: Social History   Socioeconomic History  . Marital status: Married    Spouse name: Not on file  . Number of children: Not on file  . Years of education: Not on file  . Highest education level: Not on file  Occupational History  . Not on file  Social Needs  . Financial resource strain: Not on file  . Food insecurity:    Worry: Not on file    Inability: Not on file  . Transportation needs:    Medical: Not on file    Non-medical: Not on file  Tobacco Use  . Smoking status: Never Smoker  . Smokeless tobacco: Never Used  Substance and Sexual Activity  . Alcohol use: No  . Drug use: No  . Sexual activity: Yes    Birth control/protection: Other-see comments  Lifestyle  . Physical activity:    Days per week: Not on file    Minutes per session: Not on file  . Stress: Not on file  Relationships  . Social connections:    Talks on phone: Not on file    Gets together: Not on file    Attends  religious service: Not on file    Active member of club or organization: Not on file    Attends meetings of clubs or organizations: Not on file    Relationship status: Not on file  Other Topics Concern  . Not on file  Social History Narrative  . Not on file   Family History: Family History  Problem Relation Age of Onset  . Diabetes Mother   . Hypertension Mother   . Kidney disease Mother   . Hypertension Father    Allergies: Allergies  Allergen Reactions  . Penicillins Rash  . Atenolol     Achy.   . Labetalol   . Hydrochlorothiazide Rash    Achy all over/Gout    Medications: See med rec.  Review of Systems: No headache, visual changes, nausea, vomiting, diarrhea, constipation, dizziness, abdominal pain, skin rash, fevers, chills, night sweats, weight loss, swollen lymph nodes, body aches, joint swelling, muscle aches, chest pain, shortness of breath, mood changes, visual or auditory hallucinations.   Objective:   General: Well Developed, well nourished, and in no acute distress.  Neuro:  Extra-ocular muscles intact, able to move all 4 extremities, sensation grossly intact.  Deep tendon reflexes tested were normal. Psych: Alert and oriented, mood congruent with affect. ENT:  Ears and nose appear unremarkable.  Hearing grossly normal. Neck: Unremarkable overall appearance, trachea midline.  No visible thyroid enlargement. Eyes: Conjunctivae and lids appear unremarkable.  Pupils equal and round. Skin: Warm and dry, no rashes noted.  Cardiovascular: Pulses palpable, no extremity edema. Left knee: Tender to palpation at the medial joint line, pain with terminal flexion, significant effusion. ROM normal in flexion and extension and lower leg rotation. Ligaments with solid consistent endpoints including ACL, PCL, LCL, MCL. Negative Mcmurray's and provocative meniscal tests. Non painful patellar compression. Patellar and quadriceps tendons unremarkable. Hamstring and  quadriceps strength is normal.  Procedure: Real-time Ultrasound Guided aspiration/injection of left knee Device: GE Logiq E  Verbal informed consent obtained.  Time-out conducted.  Noted no overlying erythema, induration, or other signs of local infection.  Skin prepped in a sterile fashion.  Local anesthesia: Topical Ethyl chloride.  With sterile technique and under real time ultrasound guidance: Aspirated 20 mL of clear, straw-colored fluid with an 18-gauge needle, syringe switched and 1 cc Kenalog 40, 2 cc lidocaine, 2 cc bupivacaine injected easily. Completed without difficulty  Pain immediately resolved suggesting accurate placement of the medication.  Advised to call if fevers/chills, erythema, induration, drainage, or persistent bleeding.  Images permanently stored and available for review in the ultrasound unit.  Impression: Technically successful ultrasound guided injection.  Impression and Recommendations:   This case required medical decision making of moderate complexity.  Acute pain of left knee Repeat aspiration and injection. She is very tender at the medial joint line with reproduction of pain with terminal flexion consistent with a meniscal tear. Adding an MRI of the left knee, arthroscopy if no better. ___________________________________________ Gwen Her. Dianah Field, M.D., ABFM., CAQSM. Primary Care and [place] Instructor of [place] of [facility] of Medicine

## 2017-10-12 NOTE — Assessment & Plan Note (Signed)
Repeat aspiration and injection. She is very tender at the medial joint line with reproduction of pain with terminal flexion consistent with a meniscal tear. Adding an MRI of the left knee, arthroscopy if no better.

## 2017-10-21 ENCOUNTER — Other Ambulatory Visit: Payer: Self-pay | Admitting: Sports Medicine

## 2017-11-19 ENCOUNTER — Other Ambulatory Visit: Payer: Self-pay | Admitting: Osteopathic Medicine

## 2017-11-19 DIAGNOSIS — I10 Essential (primary) hypertension: Secondary | ICD-10-CM

## 2017-11-27 ENCOUNTER — Encounter: Payer: Self-pay | Admitting: Sports Medicine

## 2017-11-27 ENCOUNTER — Ambulatory Visit (INDEPENDENT_AMBULATORY_CARE_PROVIDER_SITE_OTHER): Payer: Federal, State, Local not specified - PPO

## 2017-11-27 DIAGNOSIS — M23222 Derangement of posterior horn of medial meniscus due to old tear or injury, left knee: Secondary | ICD-10-CM

## 2017-11-27 DIAGNOSIS — M25562 Pain in left knee: Secondary | ICD-10-CM

## 2017-11-27 DIAGNOSIS — M23262 Derangement of other lateral meniscus due to old tear or injury, left knee: Secondary | ICD-10-CM

## 2017-11-30 ENCOUNTER — Encounter: Payer: Self-pay | Admitting: Sports Medicine

## 2017-11-30 ENCOUNTER — Ambulatory Visit (INDEPENDENT_AMBULATORY_CARE_PROVIDER_SITE_OTHER): Payer: Federal, State, Local not specified - PPO | Admitting: Sports Medicine

## 2017-11-30 DIAGNOSIS — M25562 Pain in left knee: Secondary | ICD-10-CM

## 2017-11-30 MED ORDER — IBUPROFEN 800 MG PO TABS: 800.0000 mg | ORAL_TABLET | Freq: Three times a day (TID) | ORAL | 2 refills | Status: DC | PRN

## 2017-11-30 NOTE — Assessment & Plan Note (Signed)
Osteoarthritis with meniscal tears. Good response to aspiration and injection, only minimal discomfort now. No mechanical symptoms. Proceeding with Visco supplementation. Ibuprofen prescription strength. Continue rehab exercises, return to see me when Orthovisc is approved.

## 2017-11-30 NOTE — Progress Notes (Signed)
Subjective:    CC: Follow-up  HPI: This is a pleasant 57 year old female, we did an aspiration and injection at the last visit, she returns for the most part pain-free.  I reviewed the past medical history, family history, social history, surgical history, and allergies today and no changes were needed.  Please see the problem list section below in epic for further details.  Past Medical History: Past Medical History:  Diagnosis Date  . At high risk for altered glucose metabolism   . Hyperlipidemia   . Hypertension    Past Surgical History: Past Surgical History:  Procedure Laterality Date  . ABDOMINAL HYSTERECTOMY    . CHOLECYSTECTOMY     Social History: Social History   Socioeconomic History  . Marital status: Married    Spouse name: Not on file  . Number of children: Not on file  . Years of education: Not on file  . Highest education level: Not on file  Occupational History  . Not on file  Social Needs  . Financial resource strain: Not on file  . Food insecurity:    Worry: Not on file    Inability: Not on file  . Transportation needs:    Medical: Not on file    Non-medical: Not on file  Tobacco Use  . Smoking status: Never Smoker  . Smokeless tobacco: Never Used  Substance and Sexual Activity  . Alcohol use: No  . Drug use: No  . Sexual activity: Yes    Birth control/protection: Other-see comments  Lifestyle  . Physical activity:    Days per week: Not on file    Minutes per session: Not on file  . Stress: Not on file  Relationships  . Social connections:    Talks on phone: Not on file    Gets together: Not on file    Attends religious service: Not on file    Active member of club or organization: Not on file    Attends meetings of clubs or organizations: Not on file    Relationship status: Not on file  Other Topics Concern  . Not on file  Social History Narrative  . Not on file   Family History: Family History  Problem Relation Age of Onset    . Diabetes Mother   . Hypertension Mother   . Kidney disease Mother   . Hypertension Father    Allergies: Allergies  Allergen Reactions  . Penicillins Rash  . Atenolol     Achy.   . Labetalol   . Hydrochlorothiazide Rash    Achy all over/Gout    Medications: See med rec.  Review of Systems: No fevers, chills, night sweats, weight loss, chest pain, or shortness of breath.   Objective:    General: Well Developed, well nourished, and in no acute distress.  Neuro: Alert and oriented x3, extra-ocular muscles intact, sensation grossly intact.  HEENT: Normocephalic, atraumatic, pupils equal round reactive to light, neck supple, no masses, no lymphadenopathy, thyroid nonpalpable.  Skin: Warm and dry, no rashes. Cardiac: Regular rate and rhythm, no murmurs rubs or gallops, no lower extremity edema.  Respiratory: Clear to auscultation bilaterally. Not using accessory muscles, speaking in full sentences. Left knee: Only a bit of swelling without a palpable fluid wave Palpation normal with no warmth or joint line tenderness or patellar tenderness or condyle tenderness. ROM normal in flexion and extension and lower leg rotation. Ligaments with solid consistent endpoints including ACL, PCL, LCL, MCL. Negative Mcmurray's and provocative meniscal tests. Non  painful patellar compression. Patellar and quadriceps tendons unremarkable. Hamstring and quadriceps strength is normal.  Impression and Recommendations:    Acute pain of left knee Osteoarthritis with meniscal tears. Good response to aspiration and injection, only minimal discomfort now. No mechanical symptoms. Proceeding with Visco supplementation. Ibuprofen prescription strength. Continue rehab exercises, return to see me when Orthovisc is approved. ___________________________________________ Gwen Her. Dianah Field, M.D., ABFM., CAQSM. Primary Care and Sports Medicine [facility]  Adjunct Professor of  [place] of [facility] of Medicine

## 2017-12-01 NOTE — Telephone Encounter (Signed)
Benefits for Monovisc has been sent and waiting on insurance determination.

## 2017-12-01 NOTE — Telephone Encounter (Signed)
-----   Message from Silverio Decamp, MD sent at 11/30/2017  4:51 PM EST ----- Yes its ok. ___________________________________________ Gwen Her. Dianah Field, M.D., ABFM., CAQSM. Primary Care and Sports Medicine [place] [facility]  Adjunct Professor of [place] of [facility] of Medicine   ----- Message ----- From: Tasia Catchings, CMA Sent: 11/30/2017   3:46 PM EST To: Silverio Decamp, MD  I went to submit the information for Orthovisc and it said Monovisc was preferred. Ok to change? Please advise.  ----- Message ----- From: Silverio Decamp, MD Sent: 11/30/2017   9:20 AM EST To: Tasia Catchings, CMA  Orthovisc approval please, left knee only ___________________________________________ Gwen Her. Dianah Field, M.D., ABFM., CAQSM. Primary Care and Sports Medicine [facility]  Adjunct Professor of [place] of [facility] of Medicine

## 2017-12-06 ENCOUNTER — Telehealth: Payer: Self-pay | Admitting: Sports Medicine

## 2017-12-06 NOTE — Telephone Encounter (Signed)
Received fax from [facility] that Brianna Walton has been approved from 11/04/17 through 12/04/2018. Form sent to scan.   LVM for patient to call back.

## 2017-12-06 NOTE — Telephone Encounter (Signed)
-----   Message from Silverio Decamp, MD sent at 11/30/2017  4:51 PM EST ----- Yes its ok. ___________________________________________ Gwen Her. Dianah Field, M.D., ABFM., CAQSM. Primary Care and Sports Medicine [place] [facility]  Adjunct Professor of [place] of [facility] of Medicine   ----- Message ----- From: Tasia Catchings, CMA Sent: 11/30/2017   3:46 PM EST To: Silverio Decamp, MD  I went to submit the information for Orthovisc and it said Monovisc was preferred. Ok to change? Please advise.  ----- Message ----- From: Silverio Decamp, MD Sent: 11/30/2017   9:20 AM EST To: Tasia Catchings, CMA  Orthovisc approval please, left knee only ___________________________________________ Gwen Her. Dianah Field, M.D., ABFM., CAQSM. Primary Care and Sports Medicine [place] [facility]  Adjunct Professor of [place] of [facility] of Medicine

## 2017-12-07 NOTE — Telephone Encounter (Signed)
Patient is agreeable to completed the left knee injection. Appt on 12/12/17 at 2pm.

## 2017-12-07 NOTE — Telephone Encounter (Signed)
Left message for patient that Monovisc has been approved for the patient. Patient was asked to call back and let us know if she is willing to get a monovisc in her left knee.

## 2017-12-12 ENCOUNTER — Ambulatory Visit (INDEPENDENT_AMBULATORY_CARE_PROVIDER_SITE_OTHER): Payer: Federal, State, Local not specified - PPO | Admitting: Sports Medicine

## 2017-12-12 DIAGNOSIS — M25562 Pain in left knee: Secondary | ICD-10-CM | POA: Diagnosis not present

## 2017-12-12 NOTE — Assessment & Plan Note (Signed)
Monovisc injection #1 of 1 into the left knee, return in a month. She does have osteoarthritis with meniscal tears, she did well with aspiration and injection of steroid, Monovisc today to remove the remaining minimal discomfort.

## 2017-12-12 NOTE — Progress Notes (Signed)
   Procedure: Real-time Ultrasound Guided Injection of left knee Device: GE Logiq E  Verbal informed consent obtained.  Time-out conducted.  Noted no overlying erythema, induration, or other signs of local infection.  Skin prepped in a sterile fashion.  Local anesthesia: Topical Ethyl chloride.  With sterile technique and under real time ultrasound guidance: Using an 18-gauge needle aspirated 24 cc of clear, straw-colored fluid, syringe switched and 88 mg/4 mL of MonoVisc (sodium hyaluronate) in a prefilled syringe was injected easily into the knee. Completed without difficulty  Pain immediately resolved suggesting accurate placement of the medication.  Advised to call if fevers/chills, erythema, induration, drainage, or persistent bleeding.  Images permanently stored and available for review in the ultrasound unit.  Impression: Technically successful ultrasound guided injection.

## 2018-01-12 ENCOUNTER — Ambulatory Visit: Payer: Federal, State, Local not specified - PPO | Admitting: Sports Medicine

## 2018-01-16 ENCOUNTER — Encounter: Payer: Self-pay | Admitting: Sports Medicine

## 2018-01-16 ENCOUNTER — Ambulatory Visit (INDEPENDENT_AMBULATORY_CARE_PROVIDER_SITE_OTHER): Payer: Federal, State, Local not specified - PPO | Admitting: Sports Medicine

## 2018-01-16 DIAGNOSIS — M1712 Unilateral primary osteoarthritis, left knee: Secondary | ICD-10-CM

## 2018-01-16 NOTE — Progress Notes (Signed)
Subjective:    CC: Follow-up  HPI: This is a pleasant 57 year old female, we have been treating her for left knee osteoarthritis, at this point she has failed steroid injections, Visco supplementation with Monovisc, therapy, bracing, activity modification.  I reviewed the past medical history, family history, social history, surgical history, and allergies today and no changes were needed.  Please see the problem list section below in epic for further details.  Past Medical History: Past Medical History:  Diagnosis Date  . At high risk for altered glucose metabolism   . Hyperlipidemia   . Hypertension    Past Surgical History: Past Surgical History:  Procedure Laterality Date  . ABDOMINAL HYSTERECTOMY    . CHOLECYSTECTOMY     Social History: Social History   Socioeconomic History  . Marital status: Married    Spouse name: Not on file  . Number of children: Not on file  . Years of education: Not on file  . Highest education level: Not on file  Occupational History  . Not on file  Social Needs  . Financial resource strain: Not on file  . Food insecurity:    Worry: Not on file    Inability: Not on file  . Transportation needs:    Medical: Not on file    Non-medical: Not on file  Tobacco Use  . Smoking status: Never Smoker  . Smokeless tobacco: Never Used  Substance and Sexual Activity  . Alcohol use: No  . Drug use: No  . Sexual activity: Yes    Birth control/protection: Other-see comments  Lifestyle  . Physical activity:    Days per week: Not on file    Minutes per session: Not on file  . Stress: Not on file  Relationships  . Social connections:    Talks on phone: Not on file    Gets together: Not on file    Attends religious service: Not on file    Active member of club or organization: Not on file    Attends meetings of clubs or organizations: Not on file    Relationship status: Not on file  Other Topics Concern  . Not on file  Social History  Narrative  . Not on file   Family History: Family History  Problem Relation Age of Onset  . Diabetes Mother   . Hypertension Mother   . Kidney disease Mother   . Hypertension Father    Allergies: Allergies  Allergen Reactions  . Penicillins Rash  . Atenolol     Achy.   . Hydrochlorothiazide Rash    Achy all over/Gout   . Labetalol    Medications: See med rec.  Review of Systems: No fevers, chills, night sweats, weight loss, chest pain, or shortness of breath.   Objective:    General: Well Developed, well nourished, and in no acute distress.  Neuro: Alert and oriented x3, extra-ocular muscles intact, sensation grossly intact.  HEENT: Normocephalic, atraumatic, pupils equal round reactive to light, neck supple, no masses, no lymphadenopathy, thyroid nonpalpable.  Skin: Warm and dry, no rashes. Cardiac: Regular rate and rhythm, no murmurs rubs or gallops, no lower extremity edema.  Respiratory: Clear to auscultation bilaterally. Not using accessory muscles, speaking in full sentences.  Impression and Recommendations:    Primary osteoarthritis of left knee At this point has failed steroid injections, viscosupplementation, therapy. I think she does need an arthroplasty. FMLA paperwork filled out, referral to Dr. Berenice Primas. She is more interested in just a consultation to discuss expectations  with the surgeon.  I spent 25 minutes with this patient, greater than 50% was face-to-face time counseling regarding the above diagnoses, specifically filling out FMLA paperwork and discussing expectations for knee arthroplasty. ___________________________________________ Gwen Her. Dianah Field, M.D., ABFM., CAQSM. Primary Care and Sports Medicine [place] [facility]  Adjunct Professor of [place] of [facility] of Medicine

## 2018-01-16 NOTE — Assessment & Plan Note (Addendum)
At this point has failed steroid injections, viscosupplementation, therapy. I think she does need an arthroplasty. FMLA paperwork filled out, referral to Dr. Berenice Primas. She is more interested in just a consultation to discuss expectations with the surgeon.

## 2018-02-18 ENCOUNTER — Other Ambulatory Visit: Payer: Self-pay | Admitting: Osteopathic Medicine

## 2018-02-18 DIAGNOSIS — I10 Essential (primary) hypertension: Secondary | ICD-10-CM

## 2018-03-02 ENCOUNTER — Ambulatory Visit (INDEPENDENT_AMBULATORY_CARE_PROVIDER_SITE_OTHER): Payer: Federal, State, Local not specified - PPO | Admitting: Osteopathic Medicine

## 2018-03-02 ENCOUNTER — Encounter: Payer: Self-pay | Admitting: Osteopathic Medicine

## 2018-03-02 VITALS — BP 135/83 | HR 98 | Temp 98.2°F | Wt 227.2 lb

## 2018-03-02 DIAGNOSIS — L03116 Cellulitis of left lower limb: Secondary | ICD-10-CM

## 2018-03-02 MED ORDER — HYDROXYZINE HCL 25 MG PO TABS: 25.0000 mg | ORAL_TABLET | Freq: Three times a day (TID) | ORAL | 0 refills | Status: DC | PRN

## 2018-03-02 MED ORDER — CLINDAMYCIN HCL 300 MG PO CAPS: 300.0000 mg | ORAL_CAPSULE | Freq: Three times a day (TID) | ORAL | 0 refills | Status: DC

## 2018-03-02 MED ORDER — SULFAMETHOXAZOLE-TRIMETHOPRIM 800-160 MG PO TABS: 1.0000 | ORAL_TABLET | Freq: Two times a day (BID) | ORAL | 0 refills | Status: DC

## 2018-03-02 NOTE — Patient Instructions (Signed)

## 2018-03-02 NOTE — Progress Notes (Signed)
HPI: Brianna Walton is a 58 y.o. female who  has a past medical history of At high risk for altered glucose metabolism, Hyperlipidemia, and Hypertension.  she [facility] to [facility], 03/02/18,  for chief complaint of:  Leg concern   . Started as an itchy rash 4 days ago then developed redness and pain, was treated w/ Keflex 4 days ago, which she had allergy to, same allergy to Doxycycline. Was told it was cellulitis.     At today's visit 03/02/18 ... PMH, PSH, FH reviewed and updated as needed.  Current medication list and allergy/intolerance hx reviewed and updated as needed. (See remainder of HPI, ROS, Phys Exam below)           ASSESSMENT/PLAN: The encounter diagnosis was Cellulitis of left lower extremity.    Meds ordered this encounter  Medications  . clindamycin (CLEOCIN) 300 MG capsule    Sig: Take 1 capsule (300 mg total) by mouth 3 (three) times daily.    Dispense:  21 capsule    Refill:  0  . sulfamethoxazole-trimethoprim (BACTRIM DS,SEPTRA DS) 800-160 MG tablet    Sig: Take 1 tablet by mouth 2 (two) times daily. Fill Rx in case of reaction to Clindamycin    Dispense:  14 tablet    Refill:  0  . hydrOXYzine (ATARAX/VISTARIL) 25 MG tablet    Sig: Take 1-2 tablets (25-50 mg total) by mouth 3 (three) times daily as needed for itching.    Dispense:  30 tablet    Refill:  0        Follow-up plan: Return for if worse/no improvmement. Also due for follow up on sugars, please see me next couple weeks for A1C .                                                 ################################################# ################################################# ################################################# #################################################    Current Meds  Medication Sig  . amLODipine-benazepril (LOTREL) 10-40 MG capsule Take 1 capsule by mouth daily. Pt  needs f/u appt w/PCP for refills.  Marland Kitchen atorvastatin (LIPITOR) 20 MG tablet Take 1 tablet (20 mg total) by mouth daily.  . cloNIDine (CATAPRES) 0.2 MG tablet Take 1 tablet (0.2 mg total) by mouth 2 (two) times daily.  Marland Kitchen ibuprofen (ADVIL,MOTRIN) 800 MG tablet Take 1 tablet (800 mg total) by mouth every 8 (eight) hours as needed.  . meloxicam (MOBIC) 15 MG tablet TAKE 1 TABLET BY MOUTH EVERY DAY  . spironolactone (ALDACTONE) 25 MG tablet TAKE 1 TABLET BY MOUTH EVERY DAY    Allergies  Allergen Reactions  . Penicillins Rash  . Atenolol     Achy.   . Doxycycline Hives and Rash  . Hydrochlorothiazide Rash    Achy all over/Gout   . Keflex [Cephalexin] Hives and Rash  . Labetalol        Review of Systems:  Constitutional: No fever  Cardiac: No  chest pain, No  pressure, No palpitations  Respiratory:  No  shortness of breath. No  Cough  Gastrointestinal: No  abdominal pain  Musculoskeletal: No new myalgia/arthralgia  Skin: +Rash - red on L lower lateral leg, hives on arms   Hem/Onc: No  easy bruising/bleeding, No  abnormal lumps/bumps  Neurologic: No  weakness, No  Dizziness  Psychiatric: No  concerns with depression, No  concerns with  anxiety  Exam:  BP 135/83 (BP Location: Left Arm, Patient Position: Sitting, Cuff Size: Normal)   Pulse 98   Temp 98.2 F (36.8 C) (Oral)   Wt 227 lb 3.2 oz (103.1 kg)   BMI 39.00 kg/m   Constitutional: VS see above. General Appearance: alert, well-developed, well-nourished, NAD  Eyes: Normal lids and conjunctive, non-icteric sclera  Ears, Nose, Mouth, Throat: MMM, Normal external inspection ears/nares/mouth/lips/gums.  Neck: No masses, trachea midline.   Respiratory: Normal respiratory effort.   Musculoskeletal: Gait normal. Symmetric and independent movement of all extremities. Neg Homan's bilaterally.   Neurological: Normal balance/coordination. No tremor.  Skin: warm, dry, intact. Urticarial rash on arms bilaterally. Warm  erythematous rash on L lower lateral leg, tender, no ulceration or drainage.   Psychiatric: Normal judgment/insight. Normal mood and affect. Oriented x3.       Visit summary with medication list and pertinent instructions was printed for patient to review, patient was advised to alert [place] if any updates are needed. All questions at time of visit were answered - patient instructed to contact office with any additional concerns. ER/RTC precautions were reviewed with the patient and understanding verbalized.     Please note: voice recognition software was used to produce this document, and typos may escape review. Please contact Dr. Sheppard Coil for any needed clarifications.    Follow up plan: Return for if worse/no improvmement. Also due for follow up on sugars, please see me next couple weeks for A1C .

## 2018-03-07 ENCOUNTER — Ambulatory Visit: Payer: Federal, State, Local not specified - PPO | Admitting: Osteopathic Medicine

## 2018-03-12 ENCOUNTER — Other Ambulatory Visit: Payer: Self-pay | Admitting: Osteopathic Medicine

## 2018-03-12 DIAGNOSIS — I10 Essential (primary) hypertension: Secondary | ICD-10-CM

## 2018-03-14 ENCOUNTER — Other Ambulatory Visit: Payer: Self-pay | Admitting: Osteopathic Medicine

## 2018-03-14 DIAGNOSIS — R7303 Prediabetes: Secondary | ICD-10-CM

## 2018-04-04 ENCOUNTER — Ambulatory Visit (INDEPENDENT_AMBULATORY_CARE_PROVIDER_SITE_OTHER): Payer: Federal, State, Local not specified - PPO | Admitting: Osteopathic Medicine

## 2018-04-04 ENCOUNTER — Encounter: Payer: Self-pay | Admitting: Osteopathic Medicine

## 2018-04-04 ENCOUNTER — Other Ambulatory Visit: Payer: Self-pay

## 2018-04-04 VITALS — BP 131/72 | HR 94 | Temp 98.2°F | Wt 224.7 lb

## 2018-04-04 DIAGNOSIS — E1169 Type 2 diabetes mellitus with other specified complication: Secondary | ICD-10-CM | POA: Diagnosis not present

## 2018-04-04 DIAGNOSIS — L03116 Cellulitis of left lower limb: Secondary | ICD-10-CM

## 2018-04-04 DIAGNOSIS — R7303 Prediabetes: Secondary | ICD-10-CM

## 2018-04-04 LAB — POCT GLYCOSYLATED HEMOGLOBIN (HGB A1C): Hemoglobin A1C: 7.4 % — AB (ref 4.0–5.6)

## 2018-04-04 MED ORDER — SULFAMETHOXAZOLE-TRIMETHOPRIM 800-160 MG PO TABS: 1.0000 | ORAL_TABLET | Freq: Two times a day (BID) | ORAL | 0 refills | Status: DC

## 2018-04-04 MED ORDER — MUPIROCIN 2 % EX OINT: 1.0000 "application " | TOPICAL_OINTMENT | Freq: Three times a day (TID) | CUTANEOUS | 3 refills | Status: DC

## 2018-04-04 MED ORDER — METFORMIN HCL ER 500 MG PO TB24: 1000.0000 mg | ORAL_TABLET | Freq: Two times a day (BID) | ORAL | 3 refills | Status: DC

## 2018-04-04 NOTE — Progress Notes (Signed)
HPI: Brianna Walton is a 58 y.o. female who  has a past medical history of At high risk for altered glucose metabolism, Hyperlipidemia, and Hypertension.  she presents to [facility] today, 04/04/18,  for chief complaint of:  Cellulitis  . Seen about a month ago for cellulitis rash of LLE, allergy to Keflex and Doxycycline. I gave Rx for clindamycin and printed Rx for Bactrim in case reaction to clinda. She ended up taking clindamycin, still itchy, switched to bactrim but was taking this daily instead of bid  . Overdue for A1C recheck, pt has not made follow-up visit as instructed. She is taking Metformin 500 mg bid. A2C is 7.4%      At today's visit 04/04/18 ... PMH, PSH, FH reviewed and updated as needed.  Current medication list and allergy/intolerance hx reviewed and updated as needed. (See remainder of HPI, ROS, Phys Exam below)           ASSESSMENT/PLAN: The primary encounter diagnosis was Cellulitis of left lower extremity. Diagnoses of Type 2 diabetes mellitus with other specified complication, without long-term current use of insulin (HCC) and Prediabetes were also pertinent to this visit.   Orders Placed This Encounter  Procedures  . POCT HgB A1C     Meds ordered this encounter  Medications  . sulfamethoxazole-trimethoprim (BACTRIM DS,SEPTRA DS) 800-160 MG tablet    Sig: Take 1 tablet by mouth 2 (two) times daily.    Dispense:  14 tablet    Refill:  0  . mupirocin ointment (BACTROBAN) 2 %    Sig: Apply 1 application topically 3 (three) times daily. Apply to affected area for 7-10 days.    Dispense:  30 g    Refill:  3  . metFORMIN (GLUCOPHAGE XR) 500 MG 24 hr tablet    Sig: Take 2 tablets (1,000 mg total) by mouth 2 (two) times daily with a meal.    Dispense:  360 tablet    Refill:  3     Follow-up plan: Return in about 3 months (around 07/05/2018) for follow-up A1C / diabetes.                                                  ################################################# ################################################# ################################################# #################################################    Current Meds  Medication Sig  . amLODipine-benazepril (LOTREL) 10-40 MG capsule Take 1 capsule by mouth daily. Pt needs f/u appt w/PCP for refills.  Marland Kitchen atorvastatin (LIPITOR) 20 MG tablet Take 1 tablet (20 mg total) by mouth daily.  . cloNIDine (CATAPRES) 0.2 MG tablet TAKE 1 TABLET BY MOUTH TWICE DAILY  . hydrOXYzine (ATARAX/VISTARIL) 25 MG tablet Take 1-2 tablets (25-50 mg total) by mouth 3 (three) times daily as needed for itching.  Marland Kitchen ibuprofen (ADVIL,MOTRIN) 800 MG tablet Take 1 tablet (800 mg total) by mouth every 8 (eight) hours as needed.  . meloxicam (MOBIC) 15 MG tablet TAKE 1 TABLET BY MOUTH EVERY DAY  . spironolactone (ALDACTONE) 25 MG tablet TAKE 1 TABLET BY MOUTH EVERY DAY  . [DISCONTINUED] clindamycin (CLEOCIN) 300 MG capsule Take 1 capsule (300 mg total) by mouth 3 (three) times daily.  . [DISCONTINUED] sulfamethoxazole-trimethoprim (BACTRIM DS,SEPTRA DS) 800-160 MG tablet Take 1 tablet by mouth 2 (two) times daily. Fill Rx in case of reaction to Clindamycin    Allergies  Allergen Reactions  . Penicillins  Rash  . Atenolol Other (See Comments)    Achy.  Achy.   . Doxycycline Hives and Rash  . Hydrochlorothiazide Rash    Achy all over/Gout   . Keflex [Cephalexin] Hives and Rash  . Labetalol Other (See Comments)       Review of Systems:  Constitutional: No recent illness other than cellulitis, no fever/chills  HEENT: No  headache, no vision change  Cardiac: No  chest pain, No  pressure, No palpitations  Respiratory:  No  shortness of breath. No  Cough  Musculoskeletal: No new myalgia/arthralgia  Skin: +Rash  Hem/Onc: No  easy bruising/bleeding  Neurologic: No  weakness, No  Dizziness  Psychiatric: No   concerns with depression, No  concerns with anxiety  Exam:  BP 131/72 (BP Location: Left Arm, Patient Position: Sitting, Cuff Size: Large)   Pulse 94   Temp 98.2 F (36.8 C) (Oral)   Wt 224 lb 11.2 oz (101.9 kg)   BMI 38.57 kg/m   Constitutional: VS see above. General Appearance: alert, well-developed, well-nourished, NAD  Eyes: Normal lids and conjunctive, non-icteric sclera  Ears, Nose, Mouth, Throat: MMM, Normal external inspection ears/nares/mouth/lips/gums.  Neck: No masses, trachea midline.   Respiratory: Normal respiratory effort.  Musculoskeletal: Gait normal. Symmetric and independent movement of all extremities  Abdominal: non-tender, non-distended, no appreciable organomegaly, neg Murphy's, BS WNLx4  Neurological: Normal balance/coordination. No tremor.  Skin: warm, dry, intact. Redness and erythema - see photo - on L lower extremity, appears fairly stable from last exam 03/02/18.   Psychiatric: Normal judgment/insight. Normal mood and affect. Oriented x3.       Visit summary with medication list and pertinent instructions was printed for patient to review, patient was advised to alert [place] if any updates are needed. All questions at time of visit were answered - patient instructed to contact office with any additional concerns. ER/RTC precautions were reviewed with the patient and understanding verbalized.    Please note: voice recognition software was used to produce this document, and typos may escape review. Please contact Dr. Sheppard Coil for any needed clarifications.    Follow up plan: Return in about 3 months (around 07/05/2018) for follow-up A1C / diabetes.

## 2018-04-06 ENCOUNTER — Other Ambulatory Visit: Payer: Self-pay | Admitting: Osteopathic Medicine

## 2018-05-09 ENCOUNTER — Telehealth: Payer: Self-pay

## 2018-05-09 NOTE — Telephone Encounter (Signed)
Pt left a vm msg stating she continues to have a dry cough. As per pt, it happens every time she takes the amlodipine-benazepril rx. Requesting feedback from provider. Thanks.

## 2018-05-10 ENCOUNTER — Other Ambulatory Visit: Payer: Self-pay | Admitting: Osteopathic Medicine

## 2018-05-10 ENCOUNTER — Encounter: Payer: Self-pay | Admitting: Osteopathic Medicine

## 2018-05-10 ENCOUNTER — Ambulatory Visit (INDEPENDENT_AMBULATORY_CARE_PROVIDER_SITE_OTHER): Payer: Federal, State, Local not specified - PPO | Admitting: Osteopathic Medicine

## 2018-05-10 VITALS — Wt 221.0 lb

## 2018-05-10 DIAGNOSIS — J302 Other seasonal allergic rhinitis: Secondary | ICD-10-CM | POA: Diagnosis not present

## 2018-05-10 DIAGNOSIS — R05 Cough: Secondary | ICD-10-CM | POA: Diagnosis not present

## 2018-05-10 DIAGNOSIS — R059 Cough, unspecified: Secondary | ICD-10-CM

## 2018-05-10 MED ORDER — AMLODIPINE BESYLATE-VALSARTAN 10-160 MG PO TABS: 1.0000 | ORAL_TABLET | Freq: Every day | ORAL | 1 refills | Status: DC

## 2018-05-10 NOTE — Progress Notes (Signed)
Virtual Visit  via Video Note  I connected with      Brianna Walton on 05/10/18 at 4:00 by a telemedicine application and verified that I am speaking with the correct person using two identifiers.   I discussed the limitations of evaluation and management by telemedicine and the availability of in person appointments. The patient expressed understanding and agreed to proceed.  History of Present Illness: Tabbetha Walton is a 58 y.o. female who would like to discuss Cough, concern for BP meds   Hacking cough  Ongoing off and on for about a month Seems to be worse in the morning  Concerned might be due to her BP meds and would like to switch    Observations/Objective: Wt 221 lb (100.2 kg)   BMI 37.93 kg/m  BP Readings from Last 3 Encounters:  04/04/18 131/72  03/02/18 135/83  01/16/18 (!) 148/81   Exam: Normal Speech.  NAD  Lab and Radiology Results No results found for this or any previous visit (from the past 72 hour(s)). No results found.     Assessment and Plan: 58 y.o. female with The primary encounter diagnosis was Cough. A diagnosis of Seasonal allergies was also pertinent to this visit.   PDMP not reviewed this encounter. No orders of the defined types were placed in this encounter.  Meds ordered this encounter  Medications  . amLODipine-valsartan (EXFORGE) 10-160 MG tablet    Sig: Take 1 tablet by mouth daily.    Dispense:  30 tablet    Refill:  1   Patient Instructions  Plan:  Stop Amlodipine-Benazepril. Start Amlodipine-Valsartan.  Continue other medications as usual  If cough not better in a couple weeks, would try allergy treatment: Flonase +/- Allegra/Zyrtec/Claritin or any of their generics   If that's still not helping the cough, please come see me!  With the new medicine, we should recheck blood pressure over the next few weeks. Please call us or send a MyChart message with a summary of your BP numbers. Ideally 130 top number or less, 80  bottom number or less.     Instructions sent via MyChart. If MyChart not available, pt was given option for info via personal e-mail w/ no guarantee of protected health info over unsecured e-mail communication, and MyChart sign-up instructions were included.   Follow Up Instructions: Return for BP/cough recheck in one month, sooner if needed (will also plan for labs at that time) .    I discussed the assessment and treatment plan with the patient. The patient was provided an opportunity to ask questions and all were answered. The patient agreed with the plan and demonstrated an understanding of the instructions.   The patient was advised to call back or seek an in-person evaluation if the symptoms worsen or if the condition fails to improve as anticipated.  Provided 21 minutes of non-face-to-face time during this encounter.                      Historical information moved to improve visibility of documentation.  Past Medical History:  Diagnosis Date  . At high risk for altered glucose metabolism   . Hyperlipidemia   . Hypertension    Past Surgical History:  Procedure Laterality Date  . ABDOMINAL HYSTERECTOMY    . CHOLECYSTECTOMY     Social History   Tobacco Use  . Smoking status: Never Smoker  . Smokeless tobacco: Never Used  Substance Use Topics  . Alcohol use: No  family history includes Diabetes in her mother; Hypertension in her father and mother; Kidney disease in her mother.  Medications: Current Outpatient Medications  Medication Sig Dispense Refill  . amLODipine-valsartan (EXFORGE) 10-160 MG tablet Take 1 tablet by mouth daily. 30 tablet 1  . atorvastatin (LIPITOR) 20 MG tablet Take 1 tablet (20 mg total) by mouth daily. 90 tablet 3  . cloNIDine (CATAPRES) 0.2 MG tablet TAKE 1 TABLET BY MOUTH TWICE DAILY 180 tablet 3  . hydrOXYzine (ATARAX/VISTARIL) 25 MG tablet TAKE 1 TO 2 TABLETS(25 TO 50 MG) BY MOUTH THREE TIMES DAILY AS NEEDED FOR ITCHING  30 tablet 0  . ibuprofen (ADVIL,MOTRIN) 800 MG tablet Take 1 tablet (800 mg total) by mouth every 8 (eight) hours as needed. 90 tablet 2  . meloxicam (MOBIC) 15 MG tablet TAKE 1 TABLET BY MOUTH EVERY DAY 90 tablet 0  . metFORMIN (GLUCOPHAGE XR) 500 MG 24 hr tablet Take 2 tablets (1,000 mg total) by mouth 2 (two) times daily with a meal. 360 tablet 3  . mupirocin ointment (BACTROBAN) 2 % Apply 1 application topically 3 (three) times daily. Apply to affected area for 7-10 days. 30 g 3  . spironolactone (ALDACTONE) 25 MG tablet TAKE 1 TABLET BY MOUTH EVERY DAY 90 tablet 0  . sulfamethoxazole-trimethoprim (BACTRIM DS,SEPTRA DS) 800-160 MG tablet Take 1 tablet by mouth 2 (two) times daily. 14 tablet 0   No current facility-administered medications for this visit.    Allergies  Allergen Reactions  . Penicillins Rash  . Atenolol Other (See Comments)    Achy.  Achy.   . Doxycycline Hives and Rash  . Hydrochlorothiazide Rash    Achy all over/Gout   . Keflex [Cephalexin] Hives and Rash  . Labetalol Other (See Comments)    PDMP not reviewed this encounter. No orders of the defined types were placed in this encounter.  Meds ordered this encounter  Medications  . amLODipine-valsartan (EXFORGE) 10-160 MG tablet    Sig: Take 1 tablet by mouth daily.    Dispense:  30 tablet    Refill:  1

## 2018-05-10 NOTE — Patient Instructions (Signed)
Plan:  Stop Amlodipine-Benazepril. Start Amlodipine-Valsartan.  Continue other medications as usual  If cough not better in a couple weeks, would try allergy treatment: Flonase +/- Allegra/Zyrtec/Claritin or any of their generics   If that's still not helping the cough, please come see me!  With the new medicine, we should recheck blood pressure over the next few weeks. Please call us or send a MyChart message with a summary of your BP numbers. Ideally 130 top number or less, 80 bottom number or less.

## 2018-05-10 NOTE — Telephone Encounter (Signed)
Appointment has been made for today. No further questions at this time.  °

## 2018-05-14 ENCOUNTER — Telehealth: Payer: Self-pay | Admitting: Osteopathic Medicine

## 2018-05-14 DIAGNOSIS — Z79899 Other long term (current) drug therapy: Secondary | ICD-10-CM

## 2018-05-14 NOTE — Telephone Encounter (Signed)
Patient called and stated that Amlodipine-Valsartan has been recalled and the patient wants to know what else you recommend. I have taken it off the list and patient is aware you can call something else in tomorrow. Please advise.

## 2018-05-16 MED ORDER — VALSARTAN 80 MG PO TABS: 160.0000 mg | ORAL_TABLET | Freq: Every day | ORAL | 1 refills | Status: DC

## 2018-05-16 MED ORDER — AMLODIPINE BESYLATE 10 MG PO TABS: 10.0000 mg | ORAL_TABLET | Freq: Every day | ORAL | 1 refills | Status: DC

## 2018-05-16 NOTE — Telephone Encounter (Signed)
sent separate rx. please remind her will need to recheck BP and labs in 2-4 weeks on new medications (orders in for non-fasting BMP)

## 2018-05-16 NOTE — Telephone Encounter (Signed)
Left brief VM that Dr. Sheppard Coil has sent a new medication has been sent to the pharmacy and for the patient to have a follow up and complete blood work and follow up with PCP in about 2-4 weeks per PCP recommendations. Patient was asked to call back with any questions.

## 2018-05-16 NOTE — Addendum Note (Signed)
Addended by: Maryla Morrow on: 05/16/2018 09:44 AM   Modules accepted: Orders

## 2018-05-23 ENCOUNTER — Other Ambulatory Visit: Payer: Self-pay | Admitting: Osteopathic Medicine

## 2018-05-23 DIAGNOSIS — I10 Essential (primary) hypertension: Secondary | ICD-10-CM

## 2018-06-07 ENCOUNTER — Ambulatory Visit: Payer: Federal, State, Local not specified - PPO | Admitting: Osteopathic Medicine

## 2018-06-18 ENCOUNTER — Telehealth: Payer: Self-pay

## 2018-06-18 NOTE — Telephone Encounter (Signed)
Pt called stating that metformin rx has been recalled due to carcinogenic additives. Requesting another DM med to be sent to pharmacy. Pls advise, thanks.

## 2018-06-19 MED ORDER — METFORMIN HCL ER 750 MG PO TB24: 750.0000 mg | ORAL_TABLET | Freq: Two times a day (BID) | ORAL | 3 refills | Status: DC

## 2018-06-19 NOTE — Telephone Encounter (Signed)
I sent alternative metformin into the pharmacy.  The recall affects a 500 mg extended release formulation only from a specific manufacturer. I sent 750 mg to take twice per day, this is a bit lower than previous total daily dose, be sure to follow-up as scheduled for next A1C around mid-06/2018. The new medication should not be affected by any recall. Please confirm with your pharmacy if any questions and they can reach out to me if needed.

## 2018-06-19 NOTE — Telephone Encounter (Signed)
Pt has been updated. Aware of provider's note and change of metformin rx. As per pt, has an upcoming appt on 07/08/18 for DM check, which she confirmed to keep. No other inquiries during call.

## 2018-07-04 ENCOUNTER — Encounter: Payer: Self-pay | Admitting: Osteopathic Medicine

## 2018-07-05 ENCOUNTER — Ambulatory Visit: Payer: Federal, State, Local not specified - PPO | Admitting: Osteopathic Medicine

## 2018-07-09 ENCOUNTER — Telehealth: Payer: Self-pay

## 2018-07-09 NOTE — Telephone Encounter (Signed)
Pt called stating that metformin as been recall. Requested metformin to be change. Pt was notified on 06/19/18 that metformin was changed. Tried calling patient for clarification, no answer. Left her a reminder of provider's note from 06/19/18. Direct call back info provided.

## 2018-07-14 ENCOUNTER — Other Ambulatory Visit: Payer: Self-pay | Admitting: Osteopathic Medicine

## 2018-07-14 DIAGNOSIS — I10 Essential (primary) hypertension: Secondary | ICD-10-CM

## 2018-07-14 NOTE — Telephone Encounter (Signed)
Forwarding medication refill to PCP for review. 

## 2018-07-16 NOTE — Telephone Encounter (Signed)
I think this was on recall so we did separate Rx of valsartan + amlodipine - can we confirm w/ pharmacy if the benazepril-amlodipine is back in stock?   If in stock, ok to refill the requested Rx #90 refill x1, and CANCEL valsartan and amlodipine

## 2018-07-16 NOTE — Telephone Encounter (Signed)
Task completed. Amlodipine/benx rx is currently in stock. Verbal order given to the pharmacist. At provider's request, amlodipine and valsartan rxs cancelled.

## 2018-07-23 ENCOUNTER — Ambulatory Visit: Payer: Federal, State, Local not specified - PPO | Admitting: Osteopathic Medicine

## 2018-07-23 ENCOUNTER — Other Ambulatory Visit: Payer: Self-pay

## 2018-07-23 ENCOUNTER — Encounter: Payer: Self-pay | Admitting: Osteopathic Medicine

## 2018-07-23 VITALS — BP 123/80 | HR 89 | Temp 98.4°F | Wt 221.2 lb

## 2018-07-23 DIAGNOSIS — E1169 Type 2 diabetes mellitus with other specified complication: Secondary | ICD-10-CM

## 2018-07-23 DIAGNOSIS — R238 Other skin changes: Secondary | ICD-10-CM

## 2018-07-23 DIAGNOSIS — Z872 Personal history of diseases of the skin and subcutaneous tissue: Secondary | ICD-10-CM | POA: Diagnosis not present

## 2018-07-23 DIAGNOSIS — E559 Vitamin D deficiency, unspecified: Secondary | ICD-10-CM | POA: Diagnosis not present

## 2018-07-23 DIAGNOSIS — I10 Essential (primary) hypertension: Secondary | ICD-10-CM

## 2018-07-23 DIAGNOSIS — E790 Hyperuricemia without signs of inflammatory arthritis and tophaceous disease: Secondary | ICD-10-CM

## 2018-07-23 LAB — POCT GLYCOSYLATED HEMOGLOBIN (HGB A1C): Hemoglobin A1C: 6.7 % — AB (ref 4.0–5.6)

## 2018-07-23 MED ORDER — DICLOFENAC SODIUM 1 % TD GEL: 4.0000 g | Freq: Four times a day (QID) | TRANSDERMAL | 11 refills | Status: DC

## 2018-07-23 NOTE — Progress Notes (Signed)
HPI: Brianna Walton is a 58 y.o. female who  has a past medical history of At high risk for altered glucose metabolism, Hyperlipidemia, and Hypertension.  she presents to [facility] today, 07/23/18,  for chief complaint of:  Diabetes follow-up   DIABETES SCREENING/PREVENTIVE CARE: A1C past 3-6 mos: Yes  controlled? No   04/04/18: 7.4%  Today 07/23/18: 6.7%  BP goal <130/80: Yes   BP Readings from Last 3 Encounters:  07/23/18 123/80  04/04/18 131/72  03/02/18 135/83    LDL goal <70: needs labs done  Eye exam annually: none on file, importance discussed with patient Foot exam: No  Microalbuminuria:na Metformin: Yes  ACE/ARB: Yes    Antiplatelet if ASCVD Risk >10%: needs The 10-year ASCVD risk score Mikey Bussing DC Jr., et al., 2013) is: 15.7%   Values used to calculate the score:     Age: 37 years     Sex: Female     Is Non-Hispanic African American: Yes     Diabetic: Yes     Tobacco smoker: No     Systolic Blood Pressure: 884 mmHg     Is BP treated: Yes     HDL Cholesterol: 37 mg/dL     Total Cholesterol: 193 mg/dL  Statin: Yes   Pneumovax: No  Immunization History  Administered Date(s) Administered  . Influenza,inj,Quad PF,6+ Mos 10/14/2017  . Influenza-Unspecified 10/17/2013, 11/09/2016, 11/02/2017             At today's visit 07/23/18 ... PMH, PSH, FH reviewed and updated as needed.  Current medication list and allergy/intolerance hx reviewed and updated as needed. (See remainder of HPI, ROS, Phys Exam below)   No results found.  Results for orders placed or performed in visit on 07/23/18 (from the past 72 hour(s))  POCT HgB A1C     Status: Abnormal   Collection Time: 07/23/18 10:44 AM  Result Value Ref Range   Hemoglobin A1C 6.7 (A) 4.0 - 5.6 %   HbA1c POC (<> result, manual entry)     HbA1c, POC (prediabetic range)     HbA1c, POC (controlled diabetic range)            ASSESSMENT/PLAN: The primary  encounter diagnosis was Type 2 diabetes mellitus with other specified complication, without long-term current use of insulin ([place]). Diagnoses of History of cellulitis, Skin sensitivity, Vitamin D deficiency, Essential hypertension, benign, and Elevated uric acid in blood were also pertinent to this visit.   Orders Placed This Encounter  Procedures  . COMPLETE METABOLIC PANEL WITH GFR  . Lipid panel  . Vitamin B12  . TSH  . VITAMIN D 25 Hydroxy (Vit-D Deficiency, Fractures)  . Phosphorus  . Uric acid  . CBC  . Microalbumin / creatinine urine ratio  . POCT HgB A1C     Meds ordered this encounter  Medications  . diclofenac sodium (VOLTAREN) 1 % GEL    Sig: Apply 4 g topically 4 (four) times daily. To affected joint.    Dispense:  100 g    Refill:  11    Patient Instructions  Will trial Voltaren gel for the skin If this is not helping, call / MyChart message me!         Follow-up plan: Return in about 3 months (around 10/23/2018) for monitor A1C, see me sooner if needed.                                                 ################################################# ################################################# ################################################# #################################################  Current Meds  Medication Sig  . amLODipine (NORVASC) 10 MG tablet Take 1 tablet (10 mg total) by mouth daily.  Marland Kitchen atorvastatin (LIPITOR) 20 MG tablet Take 1 tablet (20 mg total) by mouth daily.  . cloNIDine (CATAPRES) 0.2 MG tablet TAKE 1 TABLET BY MOUTH TWICE DAILY  . metFORMIN (GLUCOPHAGE XR) 750 MG 24 hr tablet Take 1 tablet (750 mg total) by mouth 2 (two) times daily with a meal.  . spironolactone (ALDACTONE) 25 MG tablet TAKE 1 TABLET BY MOUTH EVERY DAY    Allergies  Allergen Reactions  . Penicillins Rash  . Atenolol Other (See Comments)    Achy.  Achy.   . Doxycycline Hives and Rash  .  Hydrochlorothiazide Rash    Achy all over/Gout   . Keflex [Cephalexin] Hives and Rash  . Labetalol Other (See Comments)       Review of Systems:  Constitutional: No recent illness  HEENT: No  headache, no vision change  Cardiac: No  chest pain, No  pressure, No palpitations  Respiratory:  No  shortness of breath. No  Cough  Musculoskeletal: No new myalgia/arthralgia  Skin: +Rash and burning sensation on medial L ankle  Neurologic: No  weakness, No  Dizziness   Exam:  BP 123/80 (BP Location: Left Arm, Patient Position: Sitting, Cuff Size: Normal)   Pulse 89   Temp 98.4 F (36.9 C) (Oral)   Wt 221 lb 3.2 oz (100.3 kg)   BMI 37.97 kg/m   Constitutional: VS see above. General Appearance: alert, well-developed, well-nourished, NAD  Eyes: Normal lids and conjunctive, non-icteric sclera  Ears, Nose, Mouth, Throat: MMM, Normal external inspection ears/nares/mouth/lips/gums.  Neck: No masses, trachea midline.   Respiratory: Normal respiratory effort.   Musculoskeletal: Gait normal. Symmetric and independent movement of all extremities  Neurological: Normal balance/coordination. No tremor.  Skin: warm, dry, intact.   Psychiatric: Normal judgment/insight. Normal mood and affect. Oriented x3.       Visit summary with medication list and pertinent instructions was printed for patient to review, patient was advised to alert [place] if any updates are needed. All questions at time of visit were answered - patient instructed to contact office with any additional concerns. ER/RTC precautions were reviewed with the patient and understanding verbalized.   Note: Total time spent 25 minutes, greater than 50% of the visit was spent face-to-face counseling and coordinating care for the following: The primary encounter diagnosis was Type 2 diabetes mellitus with other specified complication, without long-term current use of insulin ([place]). Diagnoses of History of cellulitis, Skin  sensitivity, Vitamin D deficiency, Essential hypertension, benign, and Elevated uric acid in blood were also pertinent to this visit.Marland Kitchen  Please note: voice recognition software was used to produce this document, and typos may escape review. Please contact Dr. Sheppard Coil for any needed clarifications.    Follow up plan: Return in about 3 months (around 10/23/2018) for monitor A1C, see me sooner if needed.

## 2018-07-23 NOTE — Patient Instructions (Signed)
Will trial Voltaren gel for the skin If this is not helping, call / MyChart message me!

## 2018-07-24 LAB — COMPLETE METABOLIC PANEL WITH GFR
AG Ratio: 1.5 (calc) (ref 1.0–2.5)
ALT: 22 U/L (ref 6–29)
AST: 19 U/L (ref 10–35)
Albumin: 4.3 g/dL (ref 3.6–5.1)
Alkaline phosphatase (APISO): 83 U/L (ref 37–153)
BUN: 15 mg/dL (ref 7–25)
CO2: 30 mmol/L (ref 20–32)
Calcium: 9.5 mg/dL (ref 8.6–10.4)
Chloride: 103 mmol/L (ref 98–110)
Creat: 1.05 mg/dL (ref 0.50–1.05)
GFR, Est African American: 68 mL/min/{1.73_m2} (ref >=60)
GFR, Est Non African American: 58 mL/min/{1.73_m2} — ABNORMAL LOW (ref >=60)
Globulin: 2.9 g/dL (calc) (ref 1.9–3.7)
Glucose, Bld: 110 mg/dL — ABNORMAL HIGH (ref 65–99)
Potassium: 4.1 mmol/L (ref 3.5–5.3)
Sodium: 139 mmol/L (ref 135–146)
Total Bilirubin: 0.4 mg/dL (ref 0.2–1.2)
Total Protein: 7.2 g/dL (ref 6.1–8.1)

## 2018-07-24 LAB — MICROALBUMIN / CREATININE URINE RATIO
Creatinine, Urine: 145 mg/dL (ref 20–275)
Microalb Creat Ratio: 82 mcg/mg creat — ABNORMAL HIGH (ref <30)
Microalb, Ur: 11.9 mg/dL

## 2018-07-24 LAB — LIPID PANEL
Cholesterol: 143 mg/dL (ref <200)
HDL: 38 mg/dL — ABNORMAL LOW (ref >=50)
LDL Cholesterol (Calc): 83 mg/dL (calc)
Non-HDL Cholesterol (Calc): 105 mg/dL (calc) (ref <130)
Total CHOL/HDL Ratio: 3.8 (calc) (ref <5.0)
Triglycerides: 122 mg/dL (ref <150)

## 2018-07-24 LAB — CBC
HCT: 40.9 % (ref 35.0–45.0)
Hemoglobin: 13.3 g/dL (ref 11.7–15.5)
MCH: 24.6 pg — ABNORMAL LOW (ref 27.0–33.0)
MCHC: 32.5 g/dL (ref 32.0–36.0)
MCV: 75.6 fL — ABNORMAL LOW (ref 80.0–100.0)
MPV: 12.3 fL (ref 7.5–12.5)
Platelets: 398 10*3/uL (ref 140–400)
RBC: 5.41 10*6/uL — ABNORMAL HIGH (ref 3.80–5.10)
RDW: 14.3 % (ref 11.0–15.0)
WBC: 8.8 10*3/uL (ref 3.8–10.8)

## 2018-07-24 LAB — VITAMIN D 25 HYDROXY (VIT D DEFICIENCY, FRACTURES): Vit D, 25-Hydroxy: 32 ng/mL (ref 30–100)

## 2018-07-24 LAB — URIC ACID: Uric Acid, Serum: 7 mg/dL (ref 2.5–7.0)

## 2018-07-24 LAB — TSH: TSH: 3.58 mIU/L (ref 0.40–4.50)

## 2018-07-24 LAB — VITAMIN B12: Vitamin B-12: 738 pg/mL (ref 200–1100)

## 2018-07-24 LAB — PHOSPHORUS: Phosphorus: 3.2 mg/dL (ref 2.5–4.5)

## 2018-07-26 ENCOUNTER — Other Ambulatory Visit: Payer: Self-pay | Admitting: Osteopathic Medicine

## 2018-07-26 ENCOUNTER — Encounter: Payer: Self-pay | Admitting: Osteopathic Medicine

## 2018-07-26 DIAGNOSIS — M109 Gout, unspecified: Secondary | ICD-10-CM | POA: Insufficient documentation

## 2018-07-26 MED ORDER — COLCHICINE 0.6 MG PO TABS: 0.6000 mg | ORAL_TABLET | Freq: Every day | ORAL | 0 refills | Status: DC

## 2018-07-26 MED ORDER — AMLODIPINE BESY-BENAZEPRIL HCL 10-40 MG PO CAPS: 1.0000 | ORAL_CAPSULE | Freq: Every day | ORAL | 1 refills | Status: DC

## 2018-07-26 MED ORDER — ATORVASTATIN CALCIUM 40 MG PO TABS: 40.0000 mg | ORAL_TABLET | Freq: Every day | ORAL | 3 refills | Status: DC

## 2018-07-26 MED ORDER — ALLOPURINOL 100 MG PO TABS: 100.0000 mg | ORAL_TABLET | Freq: Every day | ORAL | 0 refills | Status: DC

## 2018-09-07 ENCOUNTER — Other Ambulatory Visit: Payer: Self-pay

## 2018-09-07 ENCOUNTER — Encounter: Payer: Self-pay | Admitting: Sports Medicine

## 2018-09-07 ENCOUNTER — Ambulatory Visit (INDEPENDENT_AMBULATORY_CARE_PROVIDER_SITE_OTHER): Payer: Federal, State, Local not specified - PPO | Admitting: Sports Medicine

## 2018-09-07 DIAGNOSIS — M1712 Unilateral primary osteoarthritis, left knee: Secondary | ICD-10-CM

## 2018-09-07 NOTE — Assessment & Plan Note (Signed)
Brianna Walton has tried everything at this point, steroid injections, Visco supplementation, physical therapy, bracing, she did have a consultation for an arthroplasty and a partial was suggested. She went to [facility] recently and had a PRP injection with the Arthrex system without much relief. Repeat steroid injection today, and I would like her to have a consult with Dr. Francesco Runner to discuss genicular radiofrequency ablation. Continue meloxicam which helped significantly. Return to see me in 1 month.

## 2018-09-07 NOTE — Progress Notes (Addendum)
Subjective:    CC: Left knee pain  HPI: Brianna Walton is a very pleasant 58 year old female, we have been treating her for some time now for left knee osteoarthritis, she has had steroid injections, Visco supplementation, bracing, therapy.  She did go to [facility] for a PRP injection which was not effective.  She also saw 1 of our surgical colleagues who suggested a partial knee replacement.  She is here for further evaluation and discussion of other nonsurgical techniques.  She has not yet had, or considered genicular radiofrequency ablation.  She also tells me that her pain is tolerable with meloxicam.  I reviewed the past medical history, family history, social history, surgical history, and allergies today and no changes were needed.  Please see the problem list section below in epic for further details.  Past Medical History: Past Medical History:  Diagnosis Date  . At high risk for altered glucose metabolism   . Hyperlipidemia   . Hypertension    Past Surgical History: Past Surgical History:  Procedure Laterality Date  . ABDOMINAL HYSTERECTOMY    . CHOLECYSTECTOMY     Social History: Social History   Socioeconomic History  . Marital status: Married    Spouse name: Not on file  . Number of children: Not on file  . Years of education: Not on file  . Highest education level: Not on file  Occupational History  . Not on file  Social Needs  . Financial resource strain: Not on file  . Food insecurity    Worry: Not on file    Inability: Not on file  . Transportation needs    Medical: Not on file    Non-medical: Not on file  Tobacco Use  . Smoking status: Never Smoker  . Smokeless tobacco: Never Used  Substance and Sexual Activity  . Alcohol use: No  . Drug use: No  . Sexual activity: Yes    Birth control/protection: Other-see comments  Lifestyle  . Physical activity    Days per week: Not on file    Minutes per session: Not on file  . Stress: Not on file  Relationships   . Social Herbalist on phone: Not on file    Gets together: Not on file    Attends religious service: Not on file    Active member of club or organization: Not on file    Attends meetings of clubs or organizations: Not on file    Relationship status: Not on file  Other Topics Concern  . Not on file  Social History Narrative  . Not on file   Family History: Family History  Problem Relation Age of Onset  . Diabetes Mother   . Hypertension Mother   . Kidney disease Mother   . Hypertension Father    Allergies: Allergies  Allergen Reactions  . Penicillins Rash  . Atenolol Other (See Comments)    Achy.  Achy.   . Doxycycline Hives and Rash  . Hydrochlorothiazide Rash    Achy all over/Gout   . Keflex [Cephalexin] Hives and Rash  . Labetalol Other (See Comments)   Medications: See med rec.  Review of Systems: No fevers, chills, night sweats, weight loss, chest pain, or shortness of breath.   Objective:    General: Well Developed, well nourished, and in no acute distress.  Neuro: Alert and oriented x3, extra-ocular muscles intact, sensation grossly intact.  HEENT: Normocephalic, atraumatic, pupils equal round reactive to light, neck supple, no masses, no  lymphadenopathy, thyroid nonpalpable.  Skin: Warm and dry, no rashes. Cardiac: Regular rate and rhythm, no murmurs rubs or gallops, no lower extremity edema.  Respiratory: Clear to auscultation bilaterally. Not using accessory muscles, speaking in full sentences. Left knee: Normal to inspection with no erythema or effusion or obvious bony abnormalities. Minimal tenderness at the medial joint line ROM normal in flexion and extension and lower leg rotation. Ligaments with solid consistent endpoints including ACL, PCL, LCL, MCL. Negative Mcmurray's and provocative meniscal tests. Non painful patellar compression. Patellar and quadriceps tendons unremarkable. Hamstring and quadriceps strength is normal.   Procedure: Real-time Ultrasound Guided injection of the left knee Device: GE Logiq E  Verbal informed consent obtained.  Time-out conducted.  Noted no overlying erythema, induration, or other signs of local infection.  Skin prepped in a sterile fashion.  Local anesthesia: Topical Ethyl chloride.  With sterile technique and under real time ultrasound guidance:  1 cc Kenalog 40, 2 cc lidocaine, 2 cc bupivacaine injected easily Completed without difficulty  Pain immediately resolved suggesting accurate placement of the medication.  Advised to call if fevers/chills, erythema, induration, drainage, or persistent bleeding.  Images permanently stored and available for review in the ultrasound unit.  Impression: Technically successful ultrasound guided injection.  Impression and Recommendations:    Primary osteoarthritis of left knee Brianna Walton has tried everything at this point, steroid injections, Visco supplementation, physical therapy, bracing, she did have a consultation for an arthroplasty and a partial was suggested. She went to [facility] recently and had a PRP injection with the Arthrex system without much relief. Repeat steroid injection today, and I would like her to have a consult with Dr. Francesco Runner to discuss genicular radiofrequency ablation. Continue meloxicam which helped significantly. Return to see me in 1 month.   ___________________________________________ Gwen Her. Dianah Field, M.D., ABFM., CAQSM. Primary Care and Sports Medicine [facility]  Adjunct Professor of [employer] [facility] of Medicine

## 2018-10-05 ENCOUNTER — Ambulatory Visit: Payer: Federal, State, Local not specified - PPO | Admitting: Sports Medicine

## 2018-10-12 ENCOUNTER — Other Ambulatory Visit: Payer: Self-pay

## 2018-10-12 ENCOUNTER — Encounter: Payer: Self-pay | Admitting: Sports Medicine

## 2018-10-12 ENCOUNTER — Ambulatory Visit (INDEPENDENT_AMBULATORY_CARE_PROVIDER_SITE_OTHER): Payer: Federal, State, Local not specified - PPO | Admitting: Sports Medicine

## 2018-10-12 DIAGNOSIS — M1712 Unilateral primary osteoarthritis, left knee: Secondary | ICD-10-CM | POA: Diagnosis not present

## 2018-10-12 NOTE — Assessment & Plan Note (Signed)
At this point Brianna Walton has failed steroid injections, Visco supplementation, physical therapy, bracing, Arthrex leukocyte poor PRP injection, she is not interested in genicular radiofrequency ablation. For this reason I think it is absolutely overdue time for total knee arthroplasty.  Referral to Dr. Berenice Primas. She is having a rash in her left lower leg that appears to be venous stasis dermatitis, I advised her she needs to talk to her PCP about this.

## 2018-10-12 NOTE — Progress Notes (Signed)
Subjective:    CC: Follow-up  HPI: Brianna Walton returns, she continues to have pain in her left knee with a burning sensation in her left lower leg with a rash.  She understands she needs to talk to her PCP about the rash.  She has had steroid injections, Visco supplementation, she is not interested in genicular radiofrequency ablation, at this point she understands she probably needs to consider knee arthroplasty.  I reviewed the past medical history, family history, social history, surgical history, and allergies today and no changes were needed.  Please see the problem list section below in epic for further details.  Past Medical History: Past Medical History:  Diagnosis Date  . At high risk for altered glucose metabolism   . Hyperlipidemia   . Hypertension    Past Surgical History: Past Surgical History:  Procedure Laterality Date  . ABDOMINAL HYSTERECTOMY    . CHOLECYSTECTOMY     Social History: Social History   Socioeconomic History  . Marital status: Married    Spouse name: Not on file  . Number of children: Not on file  . Years of education: Not on file  . Highest education level: Not on file  Occupational History  . Not on file  Social Needs  . Financial resource strain: Not on file  . Food insecurity    Worry: Not on file    Inability: Not on file  . Transportation needs    Medical: Not on file    Non-medical: Not on file  Tobacco Use  . Smoking status: Never Smoker  . Smokeless tobacco: Never Used  Substance and Sexual Activity  . Alcohol use: No  . Drug use: No  . Sexual activity: Yes    Birth control/protection: Other-see comments  Lifestyle  . Physical activity    Days per week: Not on file    Minutes per session: Not on file  . Stress: Not on file  Relationships  . Social Herbalist on phone: Not on file    Gets together: Not on file    Attends religious service: Not on file    Active member of club or organization: Not on file   Attends meetings of clubs or organizations: Not on file    Relationship status: Not on file  Other Topics Concern  . Not on file  Social History Narrative  . Not on file   Family History: Family History  Problem Relation Age of Onset  . Diabetes Mother   . Hypertension Mother   . Kidney disease Mother   . Hypertension Father    Allergies: Allergies  Allergen Reactions  . Penicillins Rash  . Atenolol Other (See Comments)    Achy.  Achy.   . Doxycycline Hives and Rash  . Hydrochlorothiazide Rash    Achy all over/Gout   . Keflex [Cephalexin] Hives and Rash  . Labetalol Other (See Comments)   Medications: See med rec.  Review of Systems: No fevers, chills, night sweats, weight loss, chest pain, or shortness of breath.   Objective:    General: Well Developed, well nourished, and in no acute distress.  Neuro: Alert and oriented x3, extra-ocular muscles intact, sensation grossly intact.  HEENT: Normocephalic, atraumatic, pupils equal round reactive to light, neck supple, no masses, no lymphadenopathy, thyroid nonpalpable.  Skin: Warm and dry, no rashes. Cardiac: Regular rate and rhythm, no murmurs rubs or gallops, no lower extremity edema.  Respiratory: Clear to auscultation bilaterally. Not using accessory muscles, speaking  in full sentences.  Impression and Recommendations:    Primary osteoarthritis of left knee At this point Brianna Walton has failed steroid injections, Visco supplementation, physical therapy, bracing, Arthrex leukocyte poor PRP injection, she is not interested in genicular radiofrequency ablation. For this reason I think it is absolutely overdue time for total knee arthroplasty.  Referral to Dr. Berenice Primas. She is having a rash in her left lower leg that appears to be venous stasis dermatitis, I advised her she needs to talk to her PCP about this.   ___________________________________________ Gwen Her. Dianah Field, M.D., ABFM., CAQSM. Primary Care and Sports  Medicine [facility]  Adjunct Professor of [employer] [facility] of Medicine

## 2018-10-17 ENCOUNTER — Other Ambulatory Visit: Payer: Self-pay | Admitting: Osteopathic Medicine

## 2018-10-17 DIAGNOSIS — I10 Essential (primary) hypertension: Secondary | ICD-10-CM

## 2018-10-23 ENCOUNTER — Ambulatory Visit: Payer: Federal, State, Local not specified - PPO | Admitting: Osteopathic Medicine

## 2018-11-09 ENCOUNTER — Ambulatory Visit (INDEPENDENT_AMBULATORY_CARE_PROVIDER_SITE_OTHER): Payer: Federal, State, Local not specified - PPO | Admitting: Osteopathic Medicine

## 2018-11-09 ENCOUNTER — Encounter: Payer: Self-pay | Admitting: Osteopathic Medicine

## 2018-11-09 ENCOUNTER — Other Ambulatory Visit: Payer: Self-pay

## 2018-11-09 VITALS — BP 124/85 | HR 85 | Temp 98.3°F | Wt 219.0 lb

## 2018-11-09 DIAGNOSIS — E1169 Type 2 diabetes mellitus with other specified complication: Secondary | ICD-10-CM | POA: Diagnosis not present

## 2018-11-09 DIAGNOSIS — I872 Venous insufficiency (chronic) (peripheral): Secondary | ICD-10-CM

## 2018-11-09 LAB — POCT GLYCOSYLATED HEMOGLOBIN (HGB A1C): Hemoglobin A1C: 6.6 % — AB (ref 4.0–5.6)

## 2018-11-09 MED ORDER — METFORMIN HCL 500 MG PO TABS: 500.0000 mg | ORAL_TABLET | Freq: Two times a day (BID) | ORAL | 3 refills | Status: DC

## 2018-11-09 MED ORDER — TRIAMCINOLONE ACETONIDE 0.1 % EX CREA: 1.0000 "application " | TOPICAL_CREAM | Freq: Two times a day (BID) | CUTANEOUS | 1 refills | Status: DC

## 2018-11-09 NOTE — Progress Notes (Signed)
HPI: Brianna Walton is a 58 y.o. female who  has a past medical history of At high risk for altered glucose metabolism, Hyperlipidemia, and Hypertension.  she presents to [facility] today, 11/09/18,  for chief complaint of:  DM2  Rash   Rash: L lower extremity, had cellulitis, having persistent itching and skin problems    DIABETES SCREENING/PREVENTIVE CARE: A1C past 3-6 mos: Yes  controlled? No   04/04/18: 7.4%  07/23/18: 6.7%  11/09/18: 6.6%  BP goal <130/80: Yes   BP Readings from Last 3 Encounters:  11/09/18 124/85  10/12/18 (!) 142/76  09/07/18 130/84   LDL goal <70: needs labs done  Eye exam annually: none on file, importance discussed with patient Foot exam: No  Microalbuminuria:na Metformin: Yes  ACE/ARB: Yes    The 10-year ASCVD risk score Mikey Bussing DC Jr., et al., 2013) is: 12.6%   Values used to calculate the score:     Age: 54 years     Sex: Female     Is Non-Hispanic African American: Yes     Diabetic: Yes     Tobacco smoker: No     Systolic Blood Pressure: [id number] mmHg     Is BP treated: Yes     HDL Cholesterol: 38 mg/dL     Total Cholesterol: 143 mg/dL  Statin: Yes        At today's visit 11/09/18 ... PMH, PSH, FH reviewed and updated as needed.  Current medication list and allergy/intolerance hx reviewed and updated as needed. (See remainder of HPI, ROS, Phys Exam below)   No results found.  No results found for this or any previous visit (from the past 72 hour(s)).        ASSESSMENT/PLAN: The primary encounter diagnosis was Type 2 diabetes mellitus with other specified complication, without long-term current use of insulin ([place]). A diagnosis of Venous stasis dermatitis of left lower extremity was also pertinent to this visit.   Orders Placed This Encounter  Procedures  . POCT HgB A1C     Meds ordered this encounter  Medications  . triamcinolone cream (KENALOG) 0.1 %    Sig: Apply 1  application topically 2 (two) times daily. To affected area(s) as needed    Dispense:  80 g    Refill:  1  . metFORMIN (GLUCOPHAGE) 500 MG tablet    Sig: Take 1 tablet (500 mg total) by mouth 2 (two) times daily with a meal.    Dispense:  180 tablet    Refill:  3       Follow-up plan: Return in about 3 months (around 02/09/2019) for A1C follow-up, see me sooner if needed .                                                 ################################################# ################################################# ################################################# #################################################    Current Meds  Medication Sig  . amLODipine (NORVASC) 10 MG tablet Take 1 tablet (10 mg total) by mouth daily.  Marland Kitchen amLODipine-benazepril (LOTREL) 10-40 MG capsule Take 1 capsule by mouth daily.  Marland Kitchen atorvastatin (LIPITOR) 40 MG tablet Take 1 tablet (40 mg total) by mouth daily.  . cloNIDine (CATAPRES) 0.2 MG tablet TAKE 1 TABLET BY MOUTH TWICE DAILY  . diclofenac sodium (VOLTAREN) 1 % GEL Apply 4 g topically 4 (four) times daily. To affected joint.  Marland Kitchen  metFORMIN (GLUCOPHAGE XR) 750 MG 24 hr tablet Take 1 tablet (750 mg total) by mouth 2 (two) times daily with a meal.  . spironolactone (ALDACTONE) 25 MG tablet TAKE 1 TABLET BY MOUTH EVERY DAY    Allergies  Allergen Reactions  . Penicillins Rash  . Atenolol Other (See Comments)    Achy.    . Doxycycline Hives and Rash  . Hydrochlorothiazide Rash    Achy all over/Gout   . Keflex [Cephalexin] Hives and Rash  . Labetalol Other (See Comments)       Review of Systems:  Constitutional: No recent illness  HEENT: No  headache, no vision change  Cardiac: No  chest pain, No  pressure, No palpitations  Respiratory:  No  shortness of breath. No  Cough  Gastrointestinal: No  abdominal pain, no change on bowel habits  Musculoskeletal: No new  myalgia/arthralgia  Skin: +Rash  Hem/Onc: No  easy bruising/bleeding, No  abnormal lumps/bumps  Neurologic: No  weakness, No  Dizziness  Psychiatric: No  concerns with depression, No  concerns with anxiety  Exam:  BP 124/85 (BP Location: Left Arm, Patient Position: Sitting, Cuff Size: Large)   Pulse 85   Temp 98.3 F (36.8 C) (Oral)   Wt 219 lb (99.3 kg)   BMI 37.59 kg/m   Constitutional: VS see above. General Appearance: alert, well-developed, well-nourished, NAD  Eyes: Normal lids and conjunctive, non-icteric sclera  Ears, Nose, Mouth, Throat: MMM, Normal external inspection ears/nares/mouth/lips/gums.  Neck: No masses, trachea midline.   Respiratory: Normal respiratory effort. no wheeze, no rhonchi, no rales  Cardiovascular: S1/S2 normal, no murmur, no rub/gallop auscultated. RRR.   Musculoskeletal: Gait normal. Symmetric and independent movement of all extremities  Abdominal: non-tender, non-distended, no appreciable organomegaly, neg Murphy's, BS WNLx4  Neurological: Normal balance/coordination. No tremor.  Skin: warm, dry, intact. Venous stasis dermatitis veyr mild on L lower extremity   Psychiatric: Normal judgment/insight. Normal mood and affect. Oriented x3.       Visit summary with medication list and pertinent instructions was printed for patient to review, patient was advised to alert [place] if any updates are needed. All questions at time of visit were answered - patient instructed to contact office with any additional concerns. ER/RTC precautions were reviewed with the patient and understanding verbalized.   Note: Total time spent 25 minutes, greater than 50% of the visit was spent face-to-face counseling and coordinating care for the following: The primary encounter diagnosis was Type 2 diabetes mellitus with other specified complication, without long-term current use of insulin ([place]). A diagnosis of Venous stasis dermatitis of left lower extremity was also  pertinent to this visit.Marland Kitchen  Please note: voice recognition software was used to produce this document, and typos may escape review. Please contact Dr. Sheppard Coil for any needed clarifications.    Follow up plan: Return in about 3 months (around 02/09/2019) for A1C follow-up, see me sooner if needed .

## 2018-11-28 ENCOUNTER — Other Ambulatory Visit: Payer: Self-pay | Admitting: Sports Medicine

## 2018-11-28 DIAGNOSIS — Z1231 Encounter for screening mammogram for malignant neoplasm of breast: Secondary | ICD-10-CM

## 2018-12-12 ENCOUNTER — Ambulatory Visit (INDEPENDENT_AMBULATORY_CARE_PROVIDER_SITE_OTHER): Payer: Federal, State, Local not specified - PPO

## 2018-12-12 ENCOUNTER — Other Ambulatory Visit: Payer: Self-pay

## 2018-12-12 DIAGNOSIS — Z1231 Encounter for screening mammogram for malignant neoplasm of breast: Secondary | ICD-10-CM | POA: Diagnosis not present

## 2019-01-17 ENCOUNTER — Other Ambulatory Visit: Payer: Self-pay | Admitting: Physician Assistant

## 2019-01-17 DIAGNOSIS — I10 Essential (primary) hypertension: Secondary | ICD-10-CM

## 2019-01-17 MED ORDER — SPIRONOLACTONE 25 MG PO TABS: 25.0000 mg | ORAL_TABLET | Freq: Every day | ORAL | 0 refills | Status: DC

## 2019-01-18 ENCOUNTER — Other Ambulatory Visit: Payer: Self-pay | Admitting: Osteopathic Medicine

## 2019-01-18 NOTE — Telephone Encounter (Signed)
Forwarding medication refill request to the clinical pool for review. 

## 2019-01-24 ENCOUNTER — Other Ambulatory Visit: Payer: Self-pay | Admitting: Neurology

## 2019-01-24 MED ORDER — AMLODIPINE BESY-BENAZEPRIL HCL 10-40 MG PO CAPS: 1.0000 | ORAL_CAPSULE | Freq: Every day | ORAL | 0 refills | Status: DC

## 2019-02-11 ENCOUNTER — Ambulatory Visit: Payer: Federal, State, Local not specified - PPO | Admitting: Osteopathic Medicine

## 2019-03-15 ENCOUNTER — Other Ambulatory Visit: Payer: Self-pay

## 2019-03-15 DIAGNOSIS — I10 Essential (primary) hypertension: Secondary | ICD-10-CM

## 2019-03-15 MED ORDER — CLONIDINE HCL 0.2 MG PO TABS: 0.2000 mg | ORAL_TABLET | Freq: Two times a day (BID) | ORAL | 0 refills | Status: DC

## 2019-03-21 ENCOUNTER — Encounter: Payer: Self-pay | Admitting: Osteopathic Medicine

## 2019-03-21 ENCOUNTER — Ambulatory Visit (INDEPENDENT_AMBULATORY_CARE_PROVIDER_SITE_OTHER): Payer: Federal, State, Local not specified - PPO | Admitting: Osteopathic Medicine

## 2019-03-21 VITALS — BP 130/82 | HR 81 | Temp 98.5°F | Wt 225.1 lb

## 2019-03-21 DIAGNOSIS — I10 Essential (primary) hypertension: Secondary | ICD-10-CM | POA: Diagnosis not present

## 2019-03-21 DIAGNOSIS — E1169 Type 2 diabetes mellitus with other specified complication: Secondary | ICD-10-CM | POA: Diagnosis not present

## 2019-03-21 LAB — BASIC METABOLIC PANEL WITH GFR
BUN: 17 mg/dL (ref 7–25)
CO2: 29 mmol/L (ref 20–32)
Calcium: 9.5 mg/dL (ref 8.6–10.4)
Chloride: 102 mmol/L (ref 98–110)
Creat: 1.02 mg/dL (ref 0.50–1.05)
GFR, Est African American: 70 mL/min/{1.73_m2} (ref >=60)
GFR, Est Non African American: 60 mL/min/{1.73_m2} (ref >=60)
Glucose, Bld: 132 mg/dL — ABNORMAL HIGH (ref 65–99)
Potassium: 4.2 mmol/L (ref 3.5–5.3)
Sodium: 138 mmol/L (ref 135–146)

## 2019-03-21 LAB — POCT GLYCOSYLATED HEMOGLOBIN (HGB A1C): Hemoglobin A1C: 6.8 % — AB (ref 4.0–5.6)

## 2019-03-21 NOTE — Progress Notes (Signed)
Brianna Walton is a 59 y.o. female who presents to  [place] at [facility]  today, 03/21/19, seeking care for the following: . DM2 f/u - stable . HTN f/u - stable    ASSESSMENT & PLAN with other pertinent history/findings:  The primary encounter diagnosis was Type 2 diabetes mellitus with other specified complication, without long-term current use of insulin ([place]). A diagnosis of Essential hypertension, benign was also pertinent to this visit.   DIABETES SCREENING/PREVENTIVE CARE: A1C past 3-6 [id number]? No  04/04/18: 7.4%  07/23/18: 6.7%  11/09/18: 6.6%  Today 03/21/19: 6.8%  BP goal <130/80:Yes BP Readings from Last 3 Encounters:  03/21/19 130/82  11/09/18 124/85  10/12/18 (!) 142/76   LDL goal <70:was 83 in 07/2018 Eye exam annually:none on file, importance discussed with patient Foot exam:No Microalbuminuria:na Metformin:Yes ACE/ARB:Yes Statin:YesLipitor 40   The 10-year ASCVD risk score Mikey Bussing DC Jr., et al., 2013) is: 14.4%   Values used to calculate the score:     Age: 58 years     Sex: Female     Is Non-Hispanic African American: Yes     Diabetic: Yes     Tobacco smoker: No     Systolic Blood Pressure: [id number] mmHg     Is BP treated: Yes     HDL Cholesterol: 38 mg/dL     Total Cholesterol: 143 mg/dL   There are no Patient Instructions on file for this visit.   Orders Placed This Encounter  Procedures  . BASIC METABOLIC PANEL WITH GFR  . POCT HgB A1C    No orders of the defined types were placed in this encounter.   Follow-up instructions: Return in about 4 months (around 07/21/2019) for recheck A1C .                                         BP 130/82 (BP Location: Right Arm, Patient Position: Sitting, Cuff Size: Normal)   Pulse 81   Temp 98.5 F (36.9 C) (Oral)   Wt 225 lb 1.3 oz (102.1 kg)   BMI 38.63 kg/m   Current Meds  Medication  Sig  . amLODipine-benazepril (LOTREL) 10-40 MG capsule Take 1 capsule by mouth daily.  Marland Kitchen atorvastatin (LIPITOR) 40 MG tablet Take 1 tablet (40 mg total) by mouth daily.  . cloNIDine (CATAPRES) 0.2 MG tablet Take 1 tablet (0.2 mg total) by mouth 2 (two) times daily.  . diclofenac sodium (VOLTAREN) 1 % GEL Apply 4 g topically 4 (four) times daily. To affected joint.  . metFORMIN (GLUCOPHAGE) 500 MG tablet Take 1 tablet (500 mg total) by mouth 2 (two) times daily with a meal.  . spironolactone (ALDACTONE) 25 MG tablet Take 1 tablet (25 mg total) by mouth daily.  Marland Kitchen triamcinolone cream (KENALOG) 0.1 % Apply 1 application topically 2 (two) times daily. To affected area(s) as needed    No results found for this or any previous visit (from the past 72 hour(s)).  No results found.  Depression screen [facility] 2/9 02/08/2017 08/05/2016  Decreased Interest 0 0  Down, Depressed, Hopeless 0 0  PHQ - 2 Score 0 0  Altered sleeping 0 -  Tired, decreased energy 0 -  Change in appetite 0 -  Feeling bad or failure about yourself  0 -  Trouble concentrating 0 -  Moving slowly or fidgety/restless 0 -  Suicidal thoughts  0 -  PHQ-9 Score 0 -    No flowsheet data found.    All questions at time of visit were answered - patient instructed to contact office with any additional concerns or updates.  ER/RTC precautions were reviewed with the patient.  Please note: voice recognition software was used to produce this document, and typos may escape review. Please contact Dr. Sheppard Coil for any needed clarifications.

## 2019-03-25 ENCOUNTER — Other Ambulatory Visit: Payer: Self-pay | Admitting: Neurology

## 2019-03-25 DIAGNOSIS — I10 Essential (primary) hypertension: Secondary | ICD-10-CM

## 2019-03-25 MED ORDER — SPIRONOLACTONE 25 MG PO TABS: 25.0000 mg | ORAL_TABLET | Freq: Every day | ORAL | 0 refills | Status: DC

## 2019-04-18 ENCOUNTER — Other Ambulatory Visit: Payer: Self-pay

## 2019-04-18 MED ORDER — METFORMIN HCL 500 MG PO TABS: 500.0000 mg | ORAL_TABLET | Freq: Two times a day (BID) | ORAL | 3 refills | Status: DC

## 2019-04-22 ENCOUNTER — Other Ambulatory Visit: Payer: Self-pay

## 2019-04-22 MED ORDER — TRIAMCINOLONE ACETONIDE 0.1 % EX CREA: 1.0000 "application " | TOPICAL_CREAM | Freq: Two times a day (BID) | CUTANEOUS | 1 refills | Status: DC

## 2019-05-27 ENCOUNTER — Ambulatory Visit (INDEPENDENT_AMBULATORY_CARE_PROVIDER_SITE_OTHER): Payer: Federal, State, Local not specified - PPO | Admitting: Medical-Surgical

## 2019-05-27 ENCOUNTER — Encounter: Payer: Self-pay | Admitting: Medical-Surgical

## 2019-05-27 VITALS — BP 150/96 | HR 96 | Temp 98.2°F | Ht 64.0 in | Wt 225.4 lb

## 2019-05-27 DIAGNOSIS — H6591 Unspecified nonsuppurative otitis media, right ear: Secondary | ICD-10-CM

## 2019-05-27 DIAGNOSIS — H6981 Other specified disorders of Eustachian tube, right ear: Secondary | ICD-10-CM | POA: Diagnosis not present

## 2019-05-27 MED ORDER — MECLIZINE HCL 25 MG PO TABS: 25.0000 mg | ORAL_TABLET | Freq: Three times a day (TID) | ORAL | 3 refills | Status: DC | PRN

## 2019-05-27 MED ORDER — PREDNISONE 50 MG PO TABS: 50.0000 mg | ORAL_TABLET | Freq: Every day | ORAL | 0 refills | Status: DC

## 2019-05-27 MED ORDER — FLUTICASONE PROPIONATE 50 MCG/ACT NA SUSP: 2.0000 | Freq: Every day | NASAL | 2 refills | Status: DC

## 2019-05-27 NOTE — Progress Notes (Signed)
Subjective:    CC: right ear fullness/ringing  HPI: Pleasant 59 year old female presenting today with c/o right ear fullness and ringing accompanied by dizziness starting this morning when she awakened. Also reports severe sinus congestion with yellow nasal drainage for the past couple of days, worse on the right. Endorses environmental allergies for which she takes Zyrtec as needed. Has not been taking this daily this year as she had previously. Does not use nasal sprays. Denies ear pain, fever, chills, nausea, recent illnesses, and sick contacts.  I reviewed the past medical history, family history, social history, surgical history, and allergies today and no changes were needed.  Please see the problem list section below in epic for further details.  Past Medical History: Past Medical History:  Diagnosis Date  . At high risk for altered glucose metabolism   . Hyperlipidemia   . Hypertension    Past Surgical History: Past Surgical History:  Procedure Laterality Date  . ABDOMINAL HYSTERECTOMY    . CHOLECYSTECTOMY     Social History: Social History   Socioeconomic History  . Marital status: Married    Spouse name: Not on file  . Number of children: Not on file  . Years of education: Not on file  . Highest education level: Not on file  Occupational History  . Not on file  Tobacco Use  . Smoking status: Never Smoker  . Smokeless tobacco: Never Used  Substance and Sexual Activity  . Alcohol use: No  . Drug use: No  . Sexual activity: Yes    Birth control/protection: Other-see comments  Other Topics Concern  . Not on file  Social History Narrative  . Not on file   Social Determinants of Health   Financial Resource Strain:   . Difficulty of Paying Living Expenses:   Food Insecurity:   . Worried About Charity fundraiser in the Last Year:   . Arboriculturist in the Last Year:   Transportation Needs:   . Film/video editor (Medical):   Marland Kitchen Lack of Transportation  (Non-Medical):   Physical Activity:   . Days of Exercise per Week:   . Minutes of Exercise per Session:   Stress:   . Feeling of Stress :   Social Connections:   . Frequency of Communication with Friends and Family:   . Frequency of Social Gatherings with Friends and Family:   . Attends Religious Services:   . Active Member of Clubs or Organizations:   . Attends Archivist Meetings:   Marland Kitchen Marital Status:    Family History: Family History  Problem Relation Age of Onset  . Diabetes Mother   . Hypertension Mother   . Kidney disease Mother   . Hypertension Father    Allergies: Allergies  Allergen Reactions  . Penicillins Rash  . Atenolol Other (See Comments)    Achy.    . Doxycycline Hives and Rash  . Hydrochlorothiazide Rash    Achy all over/Gout   . Keflex [Cephalexin] Hives and Rash  . Labetalol Other (See Comments)   Medications: See med rec.  Review of Systems: See HPI for pertinent positives and negatives.  Objective:    General: Well Developed, well nourished, and in no acute distress.  Neuro: Alert and oriented x3.  HEENT: Normocephalic, atraumatic. Left ear blocked by cerumen, unable to visualize TMs. Bilateral ear irrigation completed by MA. Bilateral TM's slightly cloudy with cone of light intact. External ear canals patent after irrigation. No frontal or maxillary  sinus tenderness.  Skin: Warm and dry. Cardiac: Regular rate and rhythm, no murmurs rubs or gallops, no lower extremity edema.  Respiratory: Clear to auscultation bilaterally. Not using accessory muscles, speaking in full sentences.   Impression and Recommendations:    1. Middle ear effusion/Eustachian tube dysfunction Take Cetirizine (or other OTC allergy medication daily. Start Flonase 2 sprays each nare daily. Currently being treated for HTN so avoid Sudafed. Giving 5 day burst of prednisone 50mg  daily. Meclizine 25mg  TID as needed for dizziness. Discussed expected course of resolution  and warning signs to prompt return for further evaluation. - fluticasone (FLONASE) 50 MCG/ACT nasal spray; Place 2 sprays into both nostrils daily.  Dispense: 11.1 mL; Refill: 2 - meclizine (ANTIVERT) 25 MG tablet; Take 1 tablet (25 mg total) by mouth 3 (three) times daily as needed for dizziness or nausea.  Dispense: 30 tablet; Refill: 3 - predniSONE (DELTASONE) 50 MG tablet; Take 1 tablet (50 mg total) by mouth daily.  Dispense: 5 tablet; Refill: 0  Return if symptoms worsen or fail to improve. ___________________________________________ Clearnce Sorrel, DNP, APRN, FNP-BC Primary Care and [place]

## 2019-06-04 NOTE — Progress Notes (Signed)
Subjective:    CC: Ear pain not improved  HPI: Very pleasant 59 year old female presenting for continued right ear pain, ringing, and muffled hearing. Completed a 5 day burst of prednisone and has been using Zyrtec and Flonase as directed. Dizziness has resolved but she has seen no improvement in the ear symptoms. Continues to have right sinus congestion. Also endorses itching of the right ear. Denies fever, chills, nausea.   I reviewed the past medical history, family history, social history, surgical history, and allergies today and no changes were needed.  Please see the problem list section below in epic for further details.  Past Medical History: Past Medical History:  Diagnosis Date  . At high risk for altered glucose metabolism   . Hyperlipidemia   . Hypertension    Past Surgical History: Past Surgical History:  Procedure Laterality Date  . ABDOMINAL HYSTERECTOMY    . CHOLECYSTECTOMY     Social History: Social History   Socioeconomic History  . Marital status: Married    Spouse name: Not on file  . Number of children: Not on file  . Years of education: Not on file  . Highest education level: Not on file  Occupational History  . Not on file  Tobacco Use  . Smoking status: Never Smoker  . Smokeless tobacco: Never Used  Substance and Sexual Activity  . Alcohol use: No  . Drug use: No  . Sexual activity: Yes    Birth control/protection: Other-see comments  Other Topics Concern  . Not on file  Social History Narrative  . Not on file   Social Determinants of Health   Financial Resource Strain:   . Difficulty of Paying Living Expenses:   Food Insecurity:   . Worried About Charity fundraiser in the Last Year:   . Arboriculturist in the Last Year:   Transportation Needs:   . Film/video editor (Medical):   Marland Kitchen Lack of Transportation (Non-Medical):   Physical Activity:   . Days of Exercise per Week:   . Minutes of Exercise per Session:   Stress:   .  Feeling of Stress :   Social Connections:   . Frequency of Communication with Friends and Family:   . Frequency of Social Gatherings with Friends and Family:   . Attends Religious Services:   . Active Member of Clubs or Organizations:   . Attends Archivist Meetings:   Marland Kitchen Marital Status:    Family History: Family History  Problem Relation Age of Onset  . Diabetes Mother   . Hypertension Mother   . Kidney disease Mother   . Hypertension Father    Allergies: Allergies  Allergen Reactions  . Azathioprine Other (See Comments)    Unknown reaction  . Penicillins Rash  . Atenolol Other (See Comments)    Achy.    . Doxycycline Hives and Rash  . Hydrochlorothiazide Rash    Achy all over/Gout   . Keflex [Cephalexin] Hives and Rash  . Labetalol Other (See Comments)  . Latex Rash   Medications: See med rec.  Review of Systems: See HPI for pertinent positives and negatives.  Objective:    General: Well Developed, well nourished, and in no acute distress.  Neuro: Alert and oriented x3.  HEENT: Normocephalic, atraumatic. Erythema of the right ear canal with white/yellow exudate. Right TM dull, cloudy. Left TM dull, mild erythema to ear canal but no exudate. Skin: Warm and dry. Cardiac: Regular rate and rhythm, no murmurs  rubs or gallops, no lower extremity edema.  Respiratory: Clear to auscultation bilaterally. Not using accessory muscles, speaking in full sentences.   Impression and Recommendations:    1. Right ear pain No resolution with steroids, antihistamines, and Flonase. Will treat for otitis media and otitis externa of the right ear with Ciprodex ear drops BID for 7 days and Azithromycin orally. If no resolution or improvement of symptoms, may need to see ENT for further evaluation. Discussed expected course of resolution when effusion is present. Advised that clearance of effusion may take several weeks.  Return if symptoms worsen or fail to improve.   ___________________________________________ Clearnce Sorrel, DNP, APRN, FNP-BC [facility] [place]

## 2019-06-04 NOTE — Telephone Encounter (Signed)
This encounter was created in error - please disregard.

## 2019-06-05 ENCOUNTER — Ambulatory Visit (INDEPENDENT_AMBULATORY_CARE_PROVIDER_SITE_OTHER): Payer: Federal, State, Local not specified - PPO | Admitting: Medical-Surgical

## 2019-06-05 ENCOUNTER — Other Ambulatory Visit: Payer: Self-pay

## 2019-06-05 ENCOUNTER — Encounter: Payer: Self-pay | Admitting: Medical-Surgical

## 2019-06-05 VITALS — BP 147/84 | HR 93 | Temp 98.3°F | Ht 64.0 in | Wt 223.3 lb

## 2019-06-05 DIAGNOSIS — H9201 Otalgia, right ear: Secondary | ICD-10-CM | POA: Diagnosis not present

## 2019-06-05 MED ORDER — CIPROFLOXACIN-DEXAMETHASONE 0.3-0.1 % OT SUSP: 4.0000 [drp] | Freq: Two times a day (BID) | OTIC | 0 refills | Status: DC

## 2019-06-05 MED ORDER — AZITHROMYCIN 250 MG PO TABS: ORAL_TABLET | ORAL | 0 refills | Status: DC

## 2019-06-12 ENCOUNTER — Other Ambulatory Visit: Payer: Self-pay

## 2019-06-12 DIAGNOSIS — I10 Essential (primary) hypertension: Secondary | ICD-10-CM

## 2019-06-12 MED ORDER — CLONIDINE HCL 0.2 MG PO TABS: 0.2000 mg | ORAL_TABLET | Freq: Two times a day (BID) | ORAL | 1 refills | Status: DC

## 2019-06-21 ENCOUNTER — Telehealth: Payer: Self-pay

## 2019-06-21 DIAGNOSIS — H9209 Otalgia, unspecified ear: Secondary | ICD-10-CM

## 2019-06-21 NOTE — Telephone Encounter (Signed)
Patient called, wanting referral to ENT, states her ears are not getting any better. Wanting ENT in [place]  Referral pended, please add dx code and send if appropriate

## 2019-06-24 ENCOUNTER — Other Ambulatory Visit: Payer: Self-pay

## 2019-06-24 DIAGNOSIS — I10 Essential (primary) hypertension: Secondary | ICD-10-CM

## 2019-06-24 MED ORDER — SPIRONOLACTONE 25 MG PO TABS: 25.0000 mg | ORAL_TABLET | Freq: Every day | ORAL | 0 refills | Status: DC

## 2019-06-24 NOTE — Telephone Encounter (Signed)
Sorry- meant to send to Sheppard Coil. (:  Note to PCP

## 2019-07-24 ENCOUNTER — Ambulatory Visit: Payer: Federal, State, Local not specified - PPO | Admitting: Osteopathic Medicine

## 2019-07-29 ENCOUNTER — Ambulatory Visit: Payer: Federal, State, Local not specified - PPO | Admitting: Osteopathic Medicine

## 2019-08-16 ENCOUNTER — Encounter: Payer: Self-pay | Admitting: Osteopathic Medicine

## 2019-08-16 ENCOUNTER — Other Ambulatory Visit: Payer: Self-pay

## 2019-08-16 ENCOUNTER — Ambulatory Visit (INDEPENDENT_AMBULATORY_CARE_PROVIDER_SITE_OTHER): Payer: Federal, State, Local not specified - PPO | Admitting: Osteopathic Medicine

## 2019-08-16 VITALS — BP 122/83 | HR 89 | Temp 98.3°F | Wt 225.1 lb

## 2019-08-16 DIAGNOSIS — I10 Essential (primary) hypertension: Secondary | ICD-10-CM

## 2019-08-16 DIAGNOSIS — R911 Solitary pulmonary nodule: Secondary | ICD-10-CM | POA: Diagnosis not present

## 2019-08-16 DIAGNOSIS — E1169 Type 2 diabetes mellitus with other specified complication: Secondary | ICD-10-CM

## 2019-08-16 LAB — POCT GLYCOSYLATED HEMOGLOBIN (HGB A1C): Hemoglobin A1C: 7 % — AB (ref 4.0–5.6)

## 2019-08-16 MED ORDER — OZEMPIC (0.25 OR 0.5 MG/DOSE) 2 MG/1.5ML ~~LOC~~ SOPN: PEN_INJECTOR | SUBCUTANEOUS | 5 refills | Status: DC

## 2019-08-16 NOTE — Patient Instructions (Addendum)
Ozmepic: 0.25 mg weekly for 2-4 weeks Once up to 0.5 mg, can stop the Metformin   CT scan follow-up in 3 months form last scan (around Halloween)  You should get a call to schedule this

## 2019-08-16 NOTE — Progress Notes (Signed)
Brianna Walton is a 59 y.o. female who presents to  [place] at [facility]  today, 08/16/19, seeking care for the following: . DM2 f/u - stable  HTN f/u - stable  Due for f/u CT chest monitor lung nodule in low risk patient    ASSESSMENT & PLAN with other pertinent history/findings:  The primary encounter diagnosis was Type 2 diabetes mellitus with other specified complication, without long-term current use of insulin ([place]). Diagnoses of Lung nodule and Essential hypertension, benign were also pertinent to this visit.   DIABETES SCREENING/PREVENTIVE CARE: A1C past 3-6 [id number]? No  04/04/18: 7.4%  07/23/18: 6.7%  11/09/18: 6.6%  03/21/19: 6.8%  Today 08/16/19:  7.0%  BP goal <130/80:Yes BP Readings from Last 3 Encounters:  08/16/19 122/83  06/05/19 (!) 147/84  05/27/19 (!) 150/96   LDL goal <70:was 83 in 07/2018 Eye exam annually:none on file, importance discussed with patient Foot exam:No Microalbuminuria:na Metformin:Yes ACE/ARB:Yes Statin:YesLipitor 40   The 10-year ASCVD risk score Mikey Bussing DC Jr., et al., 2013) is: 14.4%   Values used to calculate the score:     Age: 48 years     Sex: Female     Is Non-Hispanic African American: Yes     Diabetic: Yes     Tobacco smoker: No     Systolic Blood Pressure: 846 mmHg     Is BP treated: Yes     HDL Cholesterol: 38 mg/dL     Total Cholesterol: 143 mg/dL   Patient Instructions  Ozmepic: 0.25 mg weekly for 2-4 weeks Once up to 0.5 mg, can stop the Metformin   CT scan follow-up in 3 months form last scan (around Halloween)  You should get a call to schedule this       Orders Placed This Encounter  Procedures  . CT CHEST WO CONTRAST  . POCT HgB A1C    Meds ordered this encounter  Medications  . Semaglutide,0.25 or 0.5MG /DOS, (OZEMPIC, 0.25 OR 0.5 MG/DOSE,) 2 MG/1.5ML SOPN    Sig: Inject 0.1875 mLs (0.25 mg total) into the skin  once a week for 28 days, THEN 0.375 mLs (0.5 mg total) once a week. Please dispense appropriate needles #50.    Dispense:  5 pen    Refill:  5    Follow-up instructions: Return in about 4 months (around 12/17/2019) for rehceck A1C, see me sooner if needed! .                                         BP 122/83 (BP Location: Left Arm, Patient Position: Sitting, Cuff Size: Normal)   Pulse 89   Temp 98.3 F (36.8 C) (Oral)   Wt (!) 225 lb 1.3 oz (102.1 kg)   BMI 38.63 kg/m   Current Meds  Medication Sig  . amLODipine-benazepril (LOTREL) 10-40 MG capsule Take 1 capsule by mouth daily.  Marland Kitchen atorvastatin (LIPITOR) 40 MG tablet Take 1 tablet (40 mg total) by mouth daily.  . ciprofloxacin-dexamethasone (CIPRODEX) OTIC suspension Place 4 drops into the right ear 2 (two) times daily.  . cloNIDine (CATAPRES) 0.2 MG tablet Take 1 tablet (0.2 mg total) by mouth 2 (two) times daily.  . diclofenac sodium (VOLTAREN) 1 % GEL Apply 4 g topically 4 (four) times daily. To affected joint.  . fluticasone (FLONASE) 50 MCG/ACT nasal spray Place 2 sprays into both nostrils  daily.  . ibuprofen (ADVIL) 200 MG tablet Take 2 tablets by mouth every 8 (eight) hours as needed.  . Lidocaine 4 % PTCH Place 1 patch onto the skin daily as needed.  . meclizine (ANTIVERT) 25 MG tablet Take 1 tablet (25 mg total) by mouth 3 (three) times daily as needed for dizziness or nausea.  . metFORMIN (GLUCOPHAGE) 500 MG tablet Take 1 tablet (500 mg total) by mouth 2 (two) times daily with a meal.  . spironolactone (ALDACTONE) 25 MG tablet Take 1 tablet (25 mg total) by mouth daily.  Marland Kitchen triamcinolone cream (KENALOG) 0.1 % Apply 1 application topically 2 (two) times daily. To affected area(s) as needed    No results found for this or any previous visit (from the past 72 hour(s)).  No results found.  Depression screen [facility] 2/9 02/08/2017 08/05/2016  Decreased Interest 0 0  Down, Depressed, Hopeless 0  0  PHQ - 2 Score 0 0  Altered sleeping 0 -  Tired, decreased energy 0 -  Change in appetite 0 -  Feeling bad or failure about yourself  0 -  Trouble concentrating 0 -  Moving slowly or fidgety/restless 0 -  Suicidal thoughts 0 -  PHQ-9 Score 0 -    No flowsheet data found.    All questions at time of visit were answered - patient instructed to contact office with any additional concerns or updates.  ER/RTC precautions were reviewed with the patient.  Please note: voice recognition software was used to produce this document, and typos may escape review. Please contact Dr. Sheppard Coil for any needed clarifications.

## 2019-08-20 ENCOUNTER — Encounter: Payer: Self-pay | Admitting: Osteopathic Medicine

## 2019-09-19 ENCOUNTER — Other Ambulatory Visit: Payer: Self-pay | Admitting: Osteopathic Medicine

## 2019-09-19 DIAGNOSIS — I10 Essential (primary) hypertension: Secondary | ICD-10-CM

## 2019-10-06 ENCOUNTER — Encounter: Payer: Self-pay | Admitting: Osteopathic Medicine

## 2019-10-07 ENCOUNTER — Other Ambulatory Visit: Payer: Self-pay

## 2019-10-07 DIAGNOSIS — I10 Essential (primary) hypertension: Secondary | ICD-10-CM

## 2019-10-07 MED ORDER — AMLODIPINE BESY-BENAZEPRIL HCL 10-40 MG PO CAPS: 1.0000 | ORAL_CAPSULE | Freq: Every day | ORAL | 0 refills | Status: DC

## 2019-10-09 ENCOUNTER — Other Ambulatory Visit: Payer: Self-pay | Admitting: Osteopathic Medicine

## 2019-10-17 ENCOUNTER — Other Ambulatory Visit: Payer: Self-pay | Admitting: Osteopathic Medicine

## 2019-11-01 ENCOUNTER — Other Ambulatory Visit: Payer: Self-pay | Admitting: Osteopathic Medicine

## 2019-11-01 DIAGNOSIS — Z1231 Encounter for screening mammogram for malignant neoplasm of breast: Secondary | ICD-10-CM

## 2019-11-11 ENCOUNTER — Ambulatory Visit (INDEPENDENT_AMBULATORY_CARE_PROVIDER_SITE_OTHER): Payer: Federal, State, Local not specified - PPO

## 2019-11-11 ENCOUNTER — Other Ambulatory Visit: Payer: Self-pay

## 2019-11-11 DIAGNOSIS — R918 Other nonspecific abnormal finding of lung field: Secondary | ICD-10-CM

## 2019-11-14 ENCOUNTER — Encounter: Payer: Self-pay | Admitting: Osteopathic Medicine

## 2019-12-16 ENCOUNTER — Other Ambulatory Visit: Payer: Self-pay | Admitting: Osteopathic Medicine

## 2019-12-16 DIAGNOSIS — I10 Essential (primary) hypertension: Secondary | ICD-10-CM

## 2019-12-17 ENCOUNTER — Ambulatory Visit: Payer: Federal, State, Local not specified - PPO | Admitting: Osteopathic Medicine

## 2019-12-18 ENCOUNTER — Ambulatory Visit: Payer: Federal, State, Local not specified - PPO

## 2019-12-19 ENCOUNTER — Other Ambulatory Visit: Payer: Self-pay

## 2019-12-19 DIAGNOSIS — I10 Essential (primary) hypertension: Secondary | ICD-10-CM

## 2019-12-19 MED ORDER — CLONIDINE HCL 0.2 MG PO TABS: 0.2000 mg | ORAL_TABLET | Freq: Two times a day (BID) | ORAL | 1 refills | Status: DC

## 2020-01-01 ENCOUNTER — Ambulatory Visit: Payer: Federal, State, Local not specified - PPO

## 2020-01-03 ENCOUNTER — Other Ambulatory Visit: Payer: Self-pay

## 2020-01-03 ENCOUNTER — Encounter: Payer: Self-pay | Admitting: Osteopathic Medicine

## 2020-01-03 ENCOUNTER — Ambulatory Visit (INDEPENDENT_AMBULATORY_CARE_PROVIDER_SITE_OTHER): Payer: Federal, State, Local not specified - PPO | Admitting: Osteopathic Medicine

## 2020-01-03 VITALS — BP 119/79 | HR 84 | Temp 98.1°F | Wt 209.1 lb

## 2020-01-03 DIAGNOSIS — I10 Essential (primary) hypertension: Secondary | ICD-10-CM

## 2020-01-03 DIAGNOSIS — E1169 Type 2 diabetes mellitus with other specified complication: Secondary | ICD-10-CM | POA: Diagnosis not present

## 2020-01-03 DIAGNOSIS — Z0000 Encounter for general adult medical examination without abnormal findings: Secondary | ICD-10-CM | POA: Diagnosis not present

## 2020-01-03 DIAGNOSIS — R918 Other nonspecific abnormal finding of lung field: Secondary | ICD-10-CM | POA: Diagnosis not present

## 2020-01-03 LAB — POCT GLYCOSYLATED HEMOGLOBIN (HGB A1C): Hemoglobin A1C: 6.9 % — AB (ref 4.0–5.6)

## 2020-01-03 NOTE — Patient Instructions (Addendum)
Lung nodule: follow-up due late Jan 2022 - late Apr 2022, you should get a call to schedule the CT. If yo udon't hear anything by Feb 2022 please call us or call radiology dept at [phone]  Sugars/diabetes: looking good!  Blood pressure: looking good!   NO medication changes today.   Labs ordered for future visit. Annual physical / preventive care was NOT performed or billed today. Orders are in for annual lab work! Please get fasting blood drawn at least 2 days before your appointment. (No food or anything to drink besides water or black coffee, 8 hours prior to blood draw. Take all medications as usual). Lab is in room 135 in the [facility] building.Open 7-5 weekdays, no appointment needed.

## 2020-01-03 NOTE — Progress Notes (Signed)
Brianna Walton is a 59 y.o. female who presents to  [place] at [facility]  today, 01/03/20, seeking care for the following:  . DM2 f/u - last A1C 08/16/2019 was 7.0. Today 01/03/2020 is 6.9 . HTN - BP today looks good! Stable.  . Lung nodule - recent CT results rechecked, will be due for another f/u CT 3-6 mos from scan done 10/2019. No new SOB, cough, unintentional weight loss, night sweats      ASSESSMENT & PLAN with other pertinent findings:  The primary encounter diagnosis was Type 2 diabetes mellitus with other specified complication, without long-term current use of insulin ([place]). Diagnoses of Essential hypertension, benign, Lung nodules, and Annual physical exam were also pertinent to this visit.   Results for orders placed or performed in visit on 01/03/20 (from the past 24 hour(s))  POCT HgB A1C     Status: Abnormal   Collection Time: 01/03/20  9:27 AM  Result Value Ref Range   Hemoglobin A1C 6.9 (A) 4.0 - 5.6 %   HbA1c POC (<> result, manual entry)     HbA1c, POC (prediabetic range)     HbA1c, POC (controlled diabetic range)      BP Readings from Last 3 Encounters:  01/03/20 119/79  08/16/19 122/83  06/05/19 (!) 147/84   Wt Readings from Last 3 Encounters:  01/03/20 209 lb 1.9 oz (94.9 kg)  08/16/19 (!) 225 lb 1.3 oz (102.1 kg)  06/05/19 223 lb 4.8 oz (101.3 kg)      Patient Instructions  Lung nodule: follow-up due late Jan 2022 - late Apr 2022, you should get a call to schedule the CT. If yo udon't hear anything by Feb 2022 please call us or call radiology dept at [phone]  Sugars/diabetes: looking good!  Blood pressure: looking good!   NO medication changes today.   Labs ordered for future visit. Annual physical / preventive care was NOT performed or billed today. Orders are in for annual lab work! Please get fasting blood drawn at least 2 days before your appointment. (No food or anything to drink besides  water or black coffee, 8 hours prior to blood draw. Take all medications as usual). Lab is in room 135 in the [facility] building.Open 7-5 weekdays, no appointment needed.      Orders Placed This Encounter  Procedures  . CT Chest Wo Contrast  . CBC  . COMPLETE METABOLIC PANEL WITH GFR  . Lipid panel  . Hemoglobin A1c  . Microalbumin / creatinine urine ratio  . POCT HgB A1C    No orders of the defined types were placed in this encounter.      Follow-up instructions: Return for CT chest when scheduled. Annual w/ labs prior to that appt in 4 months! See [place] sooner if needed! .                                         BP 119/79 (BP Location: Left Arm, Patient Position: Sitting, Cuff Size: Large)   Pulse 84   Temp 98.1 F (36.7 C) (Oral)   Wt 209 lb 1.9 oz (94.9 kg)   BMI 35.90 kg/m   Current Meds  Medication Sig  . amLODipine-benazepril (LOTREL) 10-40 MG capsule Take 1 capsule by mouth daily.  Marland Kitchen atorvastatin (LIPITOR) 40 MG tablet TAKE 1 TABLET(40 MG) BY MOUTH DAILY  . cloNIDine (  CATAPRES) 0.2 MG tablet Take 1 tablet (0.2 mg total) by mouth 2 (two) times daily.  . diclofenac Sodium (VOLTAREN) 1 % GEL APPLY 4 GRAMS TO AFFECTED JOINT FOUR TIMES DAILY  . fluticasone (FLONASE) 50 MCG/ACT nasal spray Place 2 sprays into both nostrils daily.  Marland Kitchen ibuprofen (ADVIL) 200 MG tablet Take 2 tablets by mouth every 8 (eight) hours as needed.  . Lidocaine 4 % PTCH Place 1 patch onto the skin daily as needed.  . meclizine (ANTIVERT) 25 MG tablet Take 1 tablet (25 mg total) by mouth 3 (three) times daily as needed for dizziness or nausea.  . metFORMIN (GLUCOPHAGE) 500 MG tablet Take 1 tablet (500 mg total) by mouth 2 (two) times daily with a meal.  . spironolactone (ALDACTONE) 25 MG tablet TAKE 1 TABLET(25 MG) BY MOUTH DAILY  . triamcinolone cream (KENALOG) 0.1 % Apply 1 application topically 2 (two) times daily. To affected area(s) as needed    Results  for orders placed or performed in visit on 01/03/20 (from the past 72 hour(s))  POCT HgB A1C     Status: Abnormal   Collection Time: 01/03/20  9:27 AM  Result Value Ref Range   Hemoglobin A1C 6.9 (A) 4.0 - 5.6 %   HbA1c POC (<> result, manual entry)     HbA1c, POC (prediabetic range)     HbA1c, POC (controlled diabetic range)      No results found.     All questions at time of visit were answered - patient instructed to contact office with any additional concerns or updates.  ER/RTC precautions were reviewed with the patient as applicable.   Please note: voice recognition software was used to produce this document, and typos may escape review. Please contact Dr. Sheppard Coil for any needed clarifications.

## 2020-01-08 ENCOUNTER — Other Ambulatory Visit: Payer: Self-pay

## 2020-01-08 ENCOUNTER — Ambulatory Visit (INDEPENDENT_AMBULATORY_CARE_PROVIDER_SITE_OTHER): Payer: Federal, State, Local not specified - PPO

## 2020-01-08 DIAGNOSIS — Z1231 Encounter for screening mammogram for malignant neoplasm of breast: Secondary | ICD-10-CM | POA: Diagnosis not present

## 2020-01-10 ENCOUNTER — Other Ambulatory Visit: Payer: Self-pay | Admitting: Osteopathic Medicine

## 2020-01-10 DIAGNOSIS — I10 Essential (primary) hypertension: Secondary | ICD-10-CM

## 2020-01-14 ENCOUNTER — Encounter (INDEPENDENT_AMBULATORY_CARE_PROVIDER_SITE_OTHER): Payer: Federal, State, Local not specified - PPO | Admitting: Osteopathic Medicine

## 2020-01-14 ENCOUNTER — Other Ambulatory Visit: Payer: Self-pay | Admitting: Osteopathic Medicine

## 2020-01-14 DIAGNOSIS — I10 Essential (primary) hypertension: Secondary | ICD-10-CM | POA: Diagnosis not present

## 2020-01-14 DIAGNOSIS — R928 Other abnormal and inconclusive findings on diagnostic imaging of breast: Secondary | ICD-10-CM

## 2020-01-22 ENCOUNTER — Ambulatory Visit
Admission: RE | Admit: 2020-01-22 | Discharge: 2020-01-22 | Disposition: A | Discharge status: Home / Self Care | Payer: Federal, State, Local not specified - PPO | Source: Ambulatory Visit | Attending: Osteopathic Medicine | Admitting: Osteopathic Medicine

## 2020-01-22 ENCOUNTER — Other Ambulatory Visit: Payer: Self-pay

## 2020-01-22 ENCOUNTER — Other Ambulatory Visit: Payer: Self-pay | Admitting: Osteopathic Medicine

## 2020-01-22 DIAGNOSIS — R928 Other abnormal and inconclusive findings on diagnostic imaging of breast: Secondary | ICD-10-CM

## 2020-01-23 ENCOUNTER — Other Ambulatory Visit: Payer: Self-pay

## 2020-01-23 ENCOUNTER — Ambulatory Visit
Admission: RE | Admit: 2020-01-23 | Discharge: 2020-01-23 | Disposition: A | Discharge status: Home / Self Care | Payer: Federal, State, Local not specified - PPO | Source: Ambulatory Visit | Attending: Osteopathic Medicine | Admitting: Osteopathic Medicine

## 2020-01-23 ENCOUNTER — Other Ambulatory Visit: Payer: Self-pay | Admitting: Osteopathic Medicine

## 2020-01-23 DIAGNOSIS — R928 Other abnormal and inconclusive findings on diagnostic imaging of breast: Secondary | ICD-10-CM

## 2020-01-29 ENCOUNTER — Other Ambulatory Visit: Payer: Federal, State, Local not specified - PPO

## 2020-02-12 ENCOUNTER — Encounter: Payer: Self-pay | Admitting: Osteopathic Medicine

## 2020-02-13 NOTE — Telephone Encounter (Signed)
Spoke to patient about needing an appt, she understood and said she would call back when she was off of work.

## 2020-03-06 ENCOUNTER — Other Ambulatory Visit: Payer: Self-pay | Admitting: Osteopathic Medicine

## 2020-03-06 NOTE — Telephone Encounter (Signed)
[facility] pharmacy requesting med refills for Ozempic. Rx not listed in active med list. Per provider's last note - pt was to stop the metformin and transition to [place]. Pls advise, thanks.

## 2020-03-06 NOTE — Telephone Encounter (Signed)
Here, does not look like it was on the medication list at her last visit.  Please call patient and ask if she has requested this medication refill versus if this was something automatic sent by the pharmacy, in which case pharmacy can cancel it if patient did not request it.  If she would like to restart it, that is fine

## 2020-03-11 NOTE — Telephone Encounter (Signed)
Per pt - refill request was automatic from pharmacy. Pt is no longer taking the medication. Pharmacy has been updated to stop all automatic refill request for Ozempic.

## 2020-03-23 ENCOUNTER — Other Ambulatory Visit: Payer: Self-pay

## 2020-03-23 ENCOUNTER — Emergency Department
Admission: EM | Admit: 2020-03-23 | Discharge: 2020-03-23 | Disposition: A | Discharge status: Home / Self Care | Payer: Federal, State, Local not specified - PPO | Source: Home / Self Care | Attending: Family Medicine | Admitting: Family Medicine

## 2020-03-23 DIAGNOSIS — R059 Cough, unspecified: Secondary | ICD-10-CM

## 2020-03-23 LAB — POC SARS CORONAVIRUS 2 AG -  ED: SARS Coronavirus 2 Ag: NEGATIVE

## 2020-03-23 MED ORDER — AZITHROMYCIN 250 MG PO TABS: ORAL_TABLET | ORAL | 0 refills | Status: DC

## 2020-03-23 MED ORDER — BENZONATATE 200 MG PO CAPS: 200.0000 mg | ORAL_CAPSULE | Freq: Two times a day (BID) | ORAL | 0 refills | Status: DC | PRN

## 2020-03-23 NOTE — ED Triage Notes (Signed)
Patient presents to Urgent Care with complaints of sore throat and productive cough since three days ago. Patient reports now because of the cough she has generalized pains, also has nasal congestion.

## 2020-03-23 NOTE — ED Provider Notes (Signed)
[name]    CSN: [id number] Arrival date & time: 03/23/20  0810      History   Chief Complaint Chief Complaint  Patient presents with  . Sore Throat    HPI Brianna Walton is a 60 y.o. female.   HPI Pleasant 60 year old woman with multiple medical problems.  Says for the last day she has had sore throat and sinus congestion cough.  Feels like it is getting worse.  She has taken no medications.  No known exposure to Covid.  Unknown whether she has had Covid vaccinations booster, she did have the original 2 vaccinations. No shortness of breath. She is having some headache and body aches. Patient does have mild tachycardia Past Medical History:  Diagnosis Date  . At high risk for altered glucose metabolism   . Hyperlipidemia   . Hypertension     Patient Active Problem List   Diagnosis Date Noted  . Gout 07/26/2018  . Primary osteoarthritis of left knee 10/12/2017  . Type 2 diabetes mellitus ([place]) 06/14/2017  . Neck pain on left side 07/08/2016  . Headache 07/08/2016  . 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 Proteinuria 09/10/2014  . Vitamin D deficiency 09/10/2014  . 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 Microalbuminuria 06/09/2014  . Lumbar radiculitis 08/08/2013  . Obesity, unspecified 05/08/2013  . Essential hypertension, benign 05/08/2013  . Dyslipidemia 05/08/2013  . Personal history of colonic polyps 05/07/2013    Past Surgical History:  Procedure Laterality Date  . ABDOMINAL HYSTERECTOMY    . CHOLECYSTECTOMY      OB History   No obstetric history on file.      Home Medications    Prior to Admission medications   Medication Sig Start Date End Date Taking? Authorizing Provider  benzonatate (TESSALON) 200 MG capsule Take 1 capsule (200 mg total) by mouth 2 (two) times daily as needed for cough. 03/23/20  Yes Raylene Everts, MD  ibuprofen (ADVIL) 200 MG tablet Take 2  tablets by mouth every 8 (eight) hours as needed.   Yes [provider]  amLODipine-benazepril (LOTREL) 10-40 MG capsule TAKE 1 CAPSULE BY MOUTH DAILY 01/13/20   Emeterio Reeve, DO  atorvastatin (LIPITOR) 40 MG tablet TAKE 1 TABLET(40 MG) BY MOUTH DAILY 10/17/19   Emeterio Reeve, DO  azithromycin (ZITHROMAX Z-PAK) 250 MG tablet Take two pills today followed by one a day until gone 03/23/20   Raylene Everts, MD  cloNIDine (CATAPRES) 0.2 MG tablet Take 1 tablet (0.2 mg total) by mouth 2 (two) times daily. 12/19/19   Emeterio Reeve, DO  diclofenac Sodium (VOLTAREN) 1 % GEL APPLY 4 GRAMS TO AFFECTED JOINT FOUR TIMES DAILY 10/09/19   Emeterio Reeve, DO  fluticasone [facility]) 50 MCG/ACT nasal spray Place 2 sprays into both nostrils daily. 05/27/19 05/26/20  Samuel Bouche, NP  Lidocaine 4 % PTCH Place 1 patch onto the skin daily as needed.    [provider]  meclizine (ANTIVERT) 25 MG tablet Take 1 tablet (25 mg total) by mouth 3 (three) times daily as needed for dizziness or nausea. 05/27/19   Samuel Bouche, NP  metFORMIN (GLUCOPHAGE) 500 MG tablet Take 1 tablet (500 mg total) by mouth 2 (two) times daily with a meal. 04/18/19   Emeterio Reeve, DO  spironolactone (ALDACTONE) 25 MG tablet TAKE 1 TABLET(25 MG) BY MOUTH DAILY 12/17/19   Emeterio Reeve, DO  triamcinolone cream (KENALOG) 0.1 % Apply 1 application topically  2 (two) times daily. To affected area(s) as needed 04/22/19   Emeterio Reeve, DO    Family History Family History  Problem Relation Age of Onset  . Diabetes Mother   . Hypertension Mother   . Kidney disease Mother   . Hypertension Father     Social History Social History   Tobacco Use  . Smoking status: Never Smoker  . Smokeless tobacco: Never Used  Substance Use Topics  . Alcohol use: No  . Drug use: No     Allergies   Azathioprine, Cephalexin, Doxycycline, Hydrochlorothiazide, Latex, Penicillins, Acetaminophen, Atenolol, and  Labetalol   Review of Systems Review of Systems See HPI  Physical Exam Triage Vital Signs ED Triage Vitals  Enc Vitals Group     BP 03/23/20 0819 (!) 138/99     Pulse Rate 03/23/20 0819 (!) 123     Resp 03/23/20 0819 18     Temp 03/23/20 0819 99 F (37.2 C)     Temp Source 03/23/20 0819 Oral     SpO2 03/23/20 0819 96 %     Weight --      Height --      Head Circumference --      Peak Flow --      Pain Score 03/23/20 0817 4     Pain Loc --      Pain Edu? --      Excl. in [place]? --    No data found.  Updated Vital Signs BP (!) 138/99 (BP Location: Right Arm)   Pulse (!) 123   Temp 99 F (37.2 C) (Oral)   Resp 18   SpO2 96%     Physical Exam Vitals reviewed.  Constitutional:      General: She is not in acute distress.    Appearance: She is well-developed and well-nourished.     Comments: Appears tired  HENT:     Head: Normocephalic and atraumatic.     Right Ear: Tympanic membrane and ear canal normal.     Left Ear: Tympanic membrane and ear canal normal.     Nose: Nose normal. No rhinorrhea.     Mouth/Throat:     Pharynx: Posterior oropharyngeal erythema present.  Eyes:     Extraocular Movements: Extraocular movements intact.     Conjunctiva/sclera: Conjunctivae normal.     Pupils: Pupils are equal, round, and reactive to light.  Cardiovascular:     Rate and Rhythm: Normal rate and regular rhythm.     Heart sounds: Normal heart sounds.  Pulmonary:     Effort: Pulmonary effort is normal. No respiratory distress.     Breath sounds: Normal breath sounds.  Abdominal:     General: There is no distension.     Palpations: Abdomen is soft.  Musculoskeletal:        General: No edema. Normal range of motion.     Cervical back: Normal range of motion and neck supple.  Lymphadenopathy:     Cervical: Cervical adenopathy present.  Skin:    General: Skin is warm and dry.  Neurological:     Mental Status: She is alert.  Psychiatric:        Behavior: Behavior normal.    Mild erythema posterior pharynx.  Cervical nodes palpable.   UC Treatments / Results  Labs (all labs ordered are listed, but only abnormal results are displayed) Labs Reviewed  NOVEL CORONAVIRUS, NAA  POC SARS CORONAVIRUS 2 AG -  ED  Coronavirus test and strep test are  both negative.  Patient has a viral infection.  EKG   Radiology No results found.  Procedures Procedures (including critical care time)  Medications Ordered in UC Medications - No data to display  Initial Impression / Assessment and Plan / UC Course  I have reviewed the triage vital signs and the nursing notes.  Pertinent labs & imaging results that were available during my care of the patient were reviewed by me and considered in my medical decision making (see chart for details).    Patient's had a cough for 3 days going on for.  She is coughing up green sputum.  She is very tired.  She feels congested.  I explained to her that this is likely a virus, however if she continues to cough for more than 7 to 10 days she can fill and take the antibiotic  Final Clinical Impressions(s) / UC Diagnoses   Final diagnoses:  Cough     Discharge Instructions     Drink plenty of water Take Tessalon 2 times a day for cough If you are not improving in the next couple days, fill and take the azithromycin antibiotic Continue Flonase nasal spray for the sinus drainage    ED Prescriptions    Medication Sig Dispense Auth. Provider   benzonatate (TESSALON) 200 MG capsule Take 1 capsule (200 mg total) by mouth 2 (two) times daily as needed for cough. 20 capsule Raylene Everts, MD   azithromycin (ZITHROMAX Z-PAK) 250 MG tablet Take two pills today followed by one a day until gone 6 tablet Meda Coffee Jennette Banker, MD     PDMP not reviewed this encounter.   Raylene Everts, MD 03/23/20 [phone]

## 2020-03-23 NOTE — Discharge Instructions (Signed)
Drink plenty of water Take Tessalon 2 times a day for cough If you are not improving in the next couple days, fill and take the azithromycin antibiotic Continue Flonase nasal spray for the sinus drainage

## 2020-03-24 LAB — NOVEL CORONAVIRUS, NAA: SARS-CoV-2, NAA: NOT DETECTED

## 2020-03-24 LAB — SARS-COV-2, NAA 2 DAY TAT

## 2020-03-27 ENCOUNTER — Other Ambulatory Visit: Payer: Federal, State, Local not specified - PPO

## 2020-03-30 ENCOUNTER — Other Ambulatory Visit: Payer: Self-pay

## 2020-03-30 ENCOUNTER — Ambulatory Visit (INDEPENDENT_AMBULATORY_CARE_PROVIDER_SITE_OTHER): Payer: Federal, State, Local not specified - PPO

## 2020-03-30 DIAGNOSIS — R918 Other nonspecific abnormal finding of lung field: Secondary | ICD-10-CM | POA: Diagnosis not present

## 2020-04-01 ENCOUNTER — Ambulatory Visit: Payer: Federal, State, Local not specified - PPO | Admitting: Osteopathic Medicine

## 2020-04-01 IMAGING — MG DIGITAL SCREENING BILAT W/ TOMO W/ CAD
8 series · 8 of 24 positions shown · non-contrast
Comparison: Previous exam(s).

CLINICAL DATA: Screening.

EXAM:
DIGITAL SCREENING BILATERAL MAMMOGRAM WITH TOMO AND CAD

[R CC synth-2D]
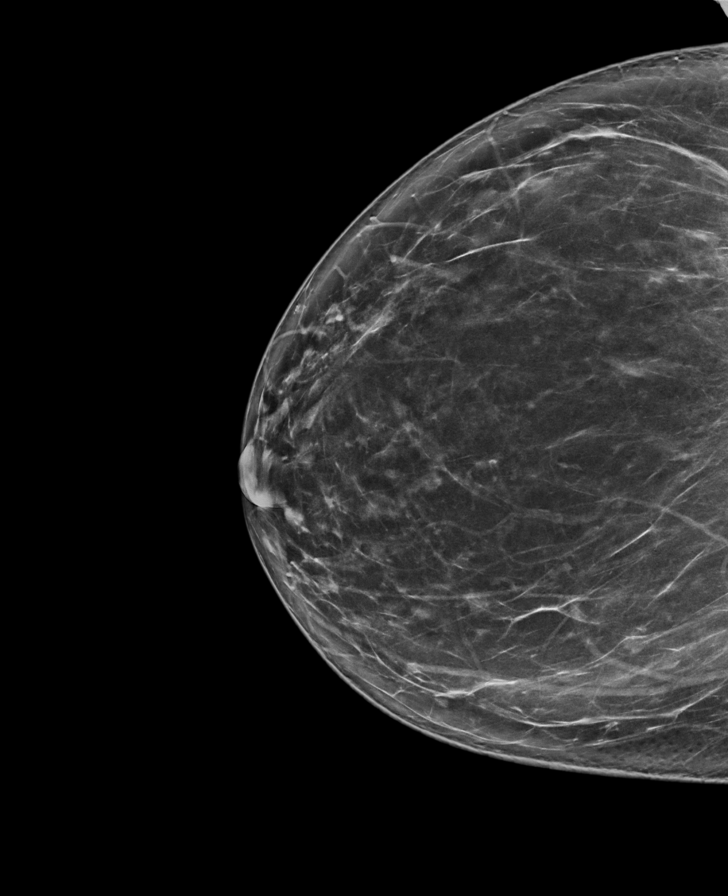

[R MLO synth-2D]
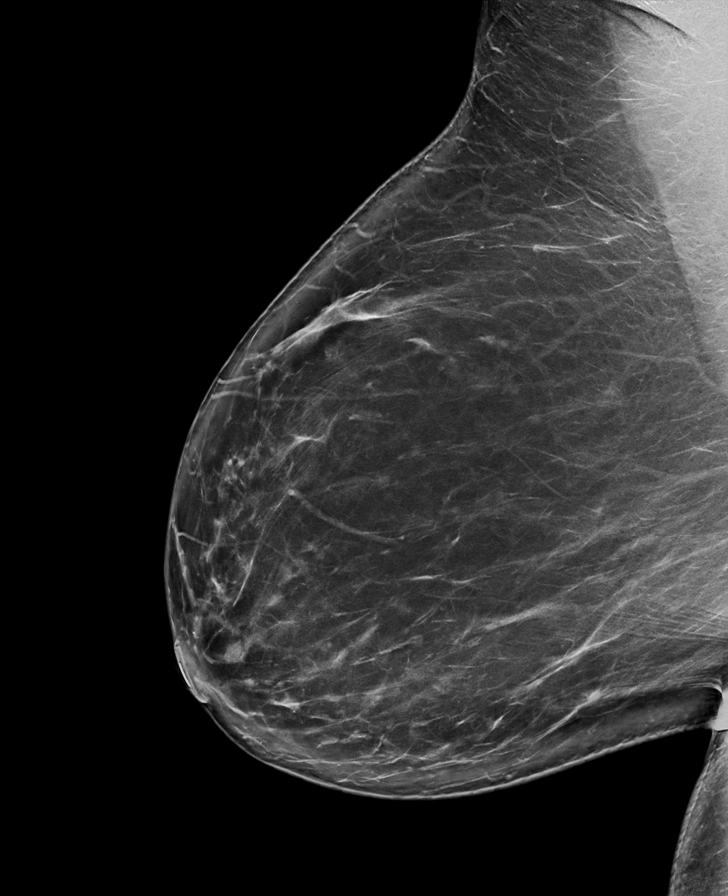

[L CC synth-2D]
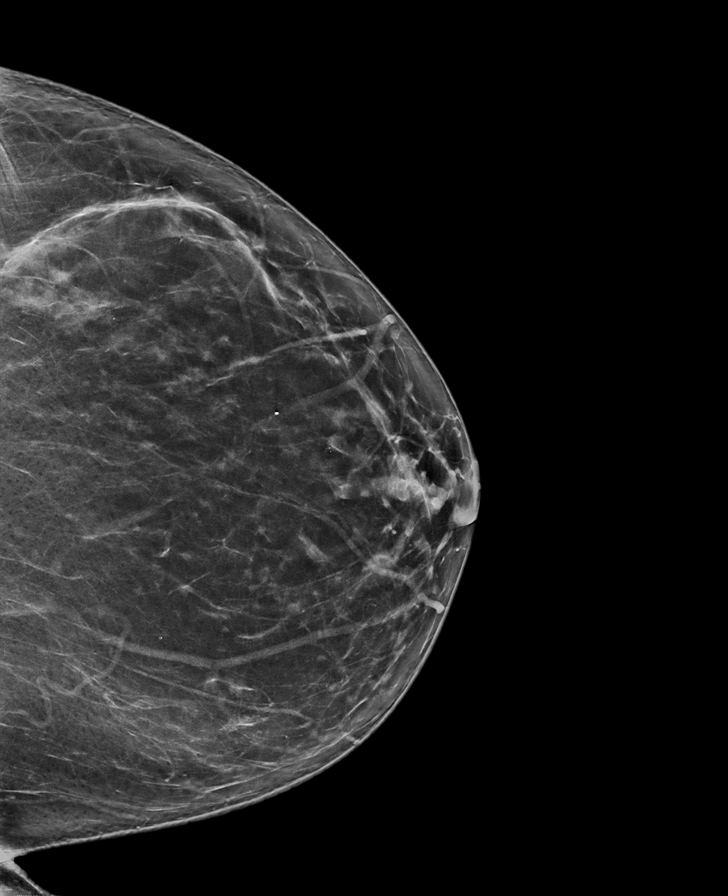

[L MLO synth-2D]
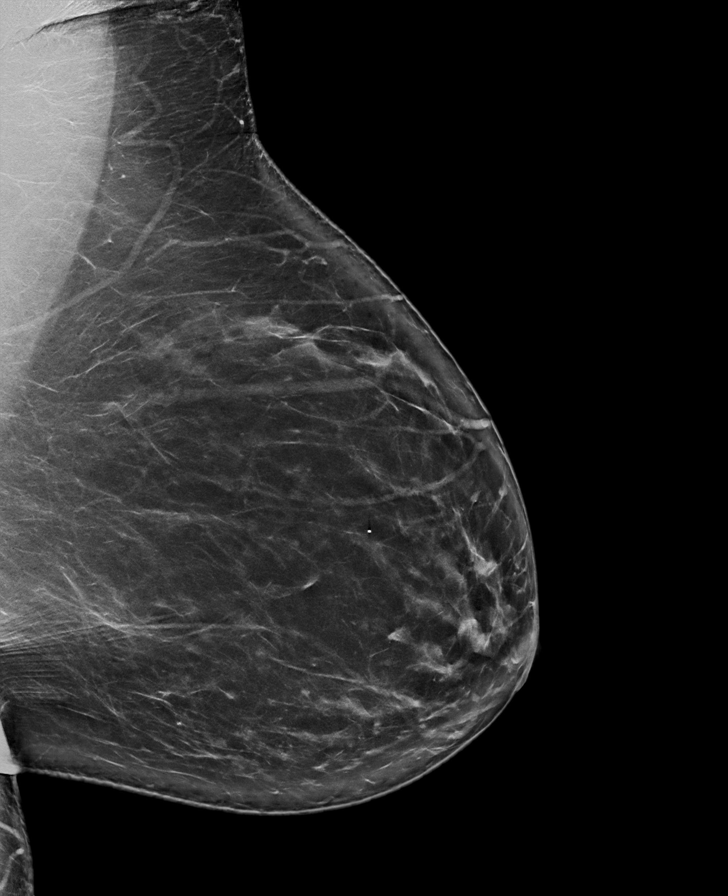

[R MLO tomo · tomo slice 51/101.0]
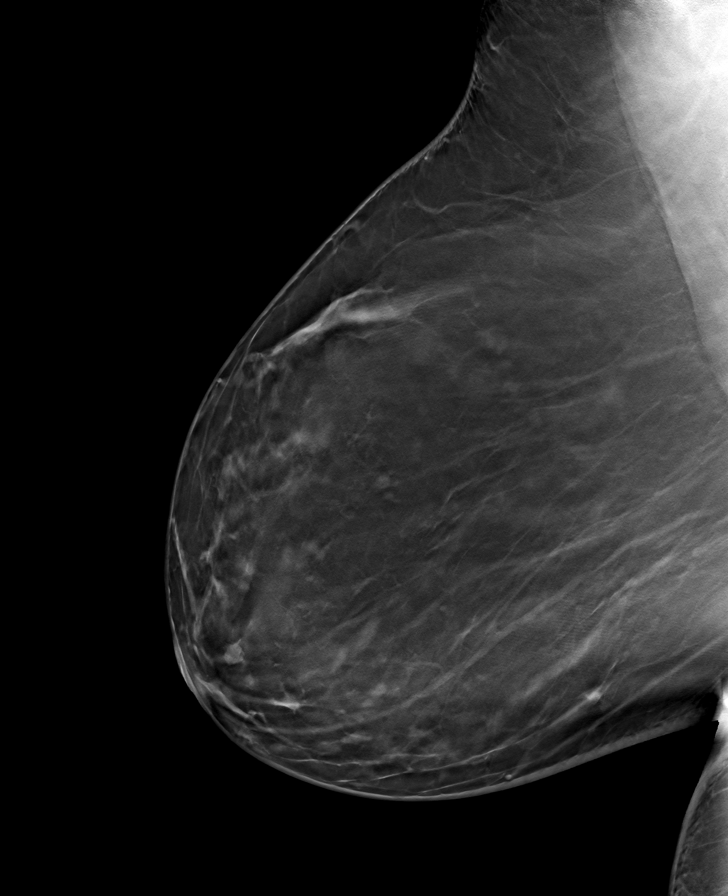

[L MLO tomo · tomo slice 50/99.0]
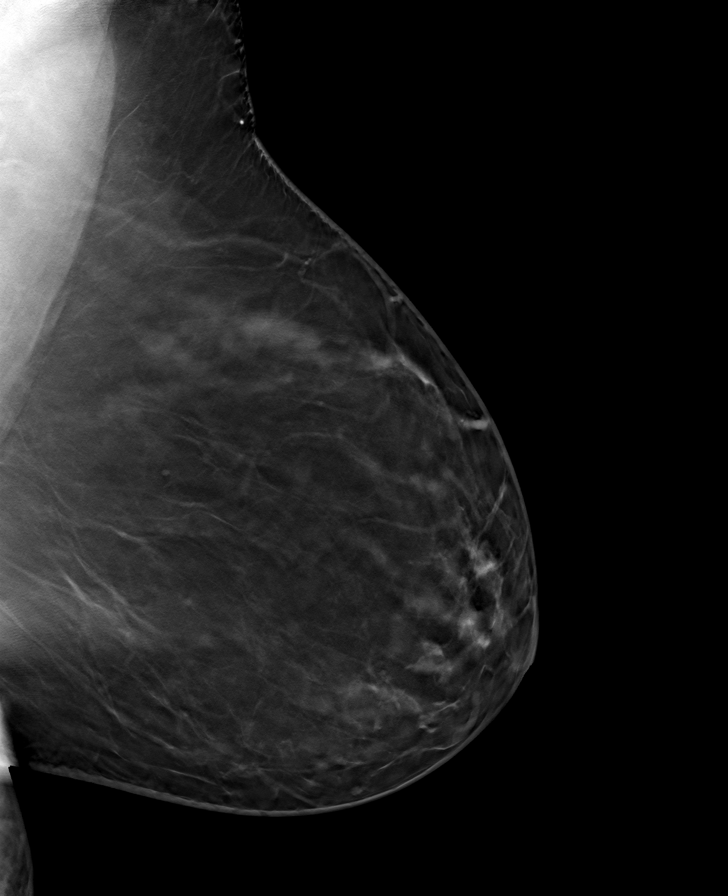

[L CC tomo · tomo slice 41/81.0]
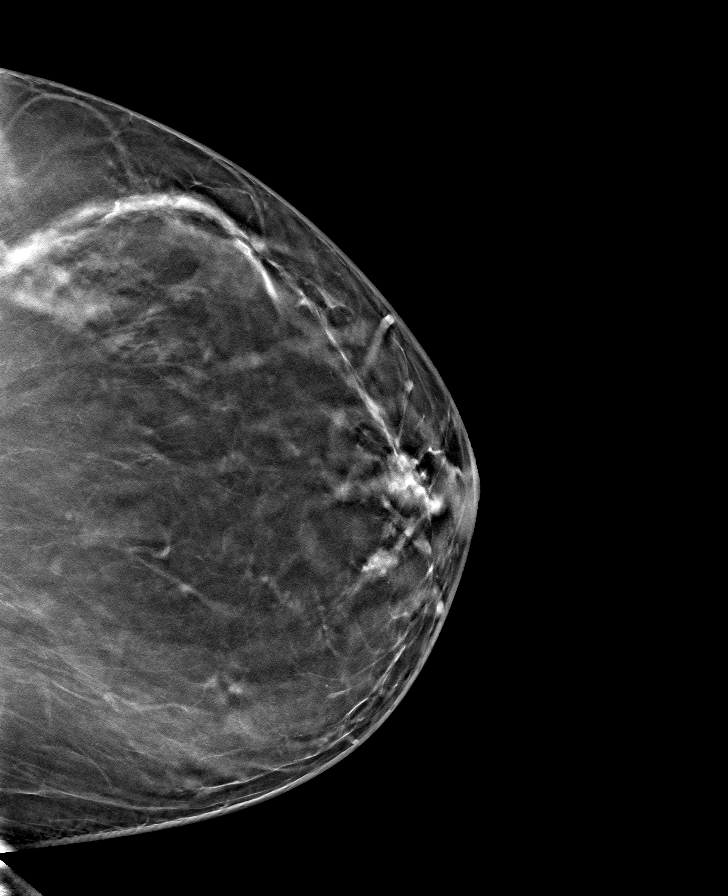

[R CC tomo · tomo slice 43/84.0]
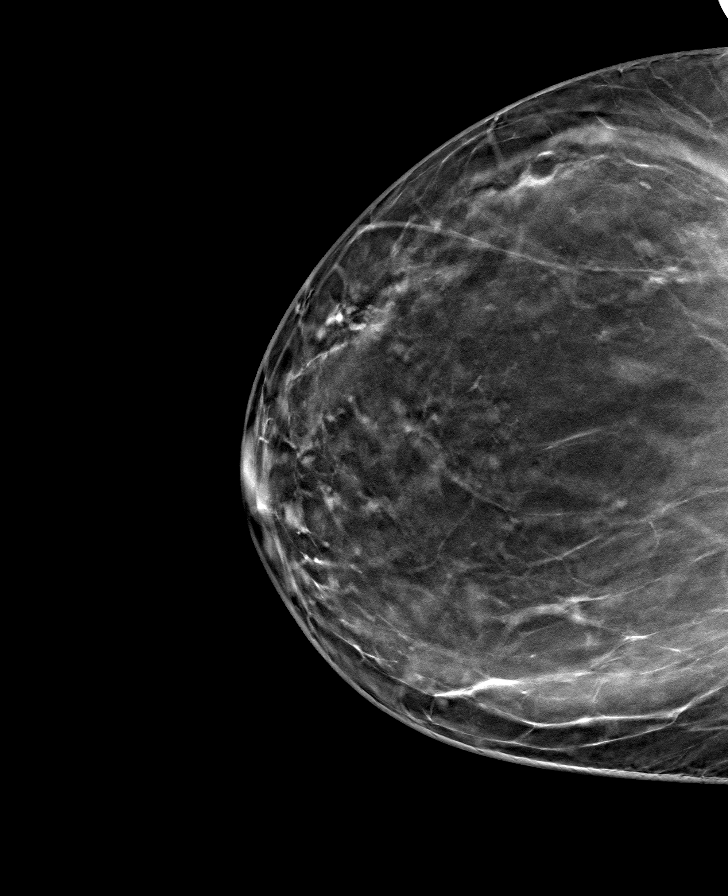

[8 of 24 positions shown; findings below may reference images not displayed]

ACR Breast Density Category b: There are scattered areas of
fibroglandular density.
FINDINGS: There are no findings suspicious for malignancy. Images were
processed with CAD.
IMPRESSION: No mammographic evidence of malignancy. A result letter of this
screening mammogram will be mailed directly to the patient.

RECOMMENDATION:
Screening mammogram in one year. (Code:[id number])

BI-RADS CATEGORY  1: Negative.

## 2020-04-09 ENCOUNTER — Ambulatory Visit (INDEPENDENT_AMBULATORY_CARE_PROVIDER_SITE_OTHER): Payer: Federal, State, Local not specified - PPO | Admitting: Osteopathic Medicine

## 2020-04-09 ENCOUNTER — Encounter: Payer: Self-pay | Admitting: Osteopathic Medicine

## 2020-04-09 ENCOUNTER — Other Ambulatory Visit: Payer: Self-pay

## 2020-04-09 VITALS — BP 132/83 | HR 85 | Temp 97.5°F | Wt 203.1 lb

## 2020-04-09 DIAGNOSIS — R911 Solitary pulmonary nodule: Secondary | ICD-10-CM | POA: Diagnosis not present

## 2020-04-09 DIAGNOSIS — J189 Pneumonia, unspecified organism: Secondary | ICD-10-CM | POA: Diagnosis not present

## 2020-04-09 DIAGNOSIS — R9389 Abnormal findings on diagnostic imaging of other specified body structures: Secondary | ICD-10-CM | POA: Diagnosis not present

## 2020-04-09 MED ORDER — ALBUTEROL SULFATE HFA 108 (90 BASE) MCG/ACT IN AERS: 2.0000 | INHALATION_SPRAY | Freq: Four times a day (QID) | RESPIRATORY_TRACT | 1 refills | Status: DC | PRN

## 2020-04-09 NOTE — Progress Notes (Signed)
Brianna Walton is a 60 y.o. female who presents to  [place] at [facility]  today, 04/09/20, seeking care for the following:   Abnormal CT chest - CT done 10 days ago to follow up pulmonary nodule, pt was feeling ill at the time w. Cough, see urgent care notes - treated w/ tessalon, zithromax. At this point, patient having mild cough in AM but otherwise doing better.  Images personally reviewed w/ patient.   IMPRESSION:  1. Interval development of extensive clustered centrilobular and tree-in-bud nodularity and consolidation throughout the lungs, particularly in the right upper lobe, but seen in all lobes. Mild, associated tubular bronchiectasis. 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 Recommend follow-up examination in 3-6 months following therapy. 2. There is a redemonstrated nodular opacity of the inferior azygoesophageal recess of the right lung measuring 1.2 x 1.0 cm, overlying sizable thoracic disc osteophytes and contiguous with more bland appearing scarring and or atelectasis superiorly. This is almost certainly bland scarring given appearance and location. Attention on follow-up in conjunction with follow-up of the above infectious airspace disease.      ASSESSMENT & PLAN with other pertinent findings:  The primary encounter diagnosis was Abnormal CT of the chest. Diagnoses of Lung nodule and Community acquired pneumonia, unspecified laterality were also pertinent to this visit.   Clinically improved, I don't have strong suspicion for true MAC Offered follow up CT vs referral to pulmonary, pt agrees to referral for second opinion.   Patient Instructions    Orders Placed This Encounter  Procedures   Ambulatory referral to Pulmonology    Meds ordered this encounter  Medications   albuterol (VENTOLIN HFA) 108 (90 Base) MCG/ACT inhaler    Sig: Inhale 2 puffs into the lungs every  6 (six) hours as needed for wheezing.    Dispense:  1 each    Refill:  1     See below for relevant physical exam findings  See below for recent lab and imaging results reviewed  Medications, allergies, PMH, PSH, SocH, [place] reviewed below    Follow-up instructions: Return in about 6 weeks (around 05/21/2020) for [place] 04/2020.                                        Exam:  BP 132/83 (BP Location: Left Arm, Patient Position: Sitting, Cuff Size: Large)    Pulse 85    Temp (!) 97.5 F (36.4 C) (Oral)    Wt 203 lb 1.9 oz (92.1 kg)    BMI 34.87 kg/m   Constitutional: VS see above. General Appearance: alert, well-developed, well-nourished, NAD  Neck: No masses, trachea midline.   Respiratory: Normal respiratory effort. no wheeze, no rhonchi, no rales  Cardiovascular: S1/S2 normal, no murmur, no rub/gallop auscultated. RRR.    Neurological: Normal balance/coordination. No tremor.  Skin: warm, dry, intact.   Psychiatric: Normal judgment/insight. Normal mood and affect. Oriented x3.   Current Meds  Medication Sig   albuterol (VENTOLIN HFA) 108 (90 Base) MCG/ACT inhaler Inhale 2 puffs into the lungs every 6 (six) hours as needed for wheezing.   amLODipine-benazepril (LOTREL) 10-40 MG capsule TAKE 1 CAPSULE BY MOUTH DAILY   atorvastatin (LIPITOR) 40 MG tablet TAKE 1 TABLET(40 MG) BY MOUTH DAILY   cloNIDine (CATAPRES) 0.2 MG tablet Take 1 tablet (0.2 mg total) by mouth 2 (two)  times daily.    Allergies  Allergen Reactions   Azathioprine Other (See Comments)    Unknown reaction   Cephalexin Hives and Rash   Doxycycline Hives and Rash   Hydrochlorothiazide Rash    Achy all over/Gout  Achy all over/Gout Achy all over/Gout   Latex Rash   Penicillins Rash, Hives, Itching and Swelling   Acetaminophen Hives   Atenolol Other (See Comments)    Achy.     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   Microalbuminuria 06/09/2014   Lumbar radiculitis 08/08/2013   Obesity, unspecified 05/08/2013   Essential hypertension, benign 05/08/2013   Dyslipidemia 05/08/2013   Personal history of colonic polyps 05/07/2013    Family History  Problem Relation Age of Onset   Diabetes Mother    Hypertension Mother    Kidney disease Mother    Hypertension Father     Social History   Tobacco Use  Smoking Status Never Smoker  Smokeless Tobacco Never Used    Past Surgical History:  Procedure Laterality Date   ABDOMINAL HYSTERECTOMY     CHOLECYSTECTOMY      Immunization History  Administered Date(s) Administered   Influenza,inj,Quad PF,6+ Mos 10/14/2017   Influenza-Unspecified 10/17/2013, 11/09/2016, 11/02/2017, 11/12/2018, 11/12/2018, 11/14/2019   PFIZER(Purple Top)SARS-COV-2 Vaccination 04/25/2019, 05/16/2019    Recent Results (from the past 2160 hour(s))  POC SARS Coronavirus 2 Ag-ED - Nasal Swab (BD Veritor Kit)     Status: None   Collection Time: 03/23/20  8:45 AM  Result Value Ref Range   SARS Coronavirus 2 Ag Negative Negative  Novel Coronavirus, NAA ([facility])     Status: None   Collection Time: 03/23/20  8:54 AM   Specimen: Nasopharyngeal(NP) swabs in vial transport medium   Nasopharynge  Result Value Ref Range   SARS-CoV-2, NAA Not Detected Not Detected    Comment: This nucleic acid amplification test was developed and its performance characteristics determined by [employer]. Nucleic acid amplification tests include RT-PCR and  TMA. This test has not been FDA cleared or approved. This test has been authorized by FDA under an Emergency Use Authorization (EUA). This test is only authorized for the duration of time the declaration that circumstances exist justifying the authorization of the emergency use of in vitro diagnostic tests for detection of SARS-CoV-2 virus and/or diagnosis of COVID-19 infection under section 564(b)(1) of the Act, 21 U.S.C. 623JSE-8(B) (1), unless the authorization is terminated or revoked sooner. When diagnostic testing is negative, the possibility of a false negative result should be considered in the context of a patient's recent exposures and the presence of clinical signs and symptoms consistent with COVID-19. An individual without symptoms of COVID-19 and who is not shedding SARS-CoV-2 virus wo uld expect to have a negative (not detected) result in this assay.   SARS-COV-2, NAA 2 DAY TAT     Status: None   Collection Time: 03/23/20  8:54 AM   Nasopharynge  Result Value Ref Range   SARS-CoV-2, NAA 2 DAY TAT Performed     No results found.  All questions at time of visit were answered - patient instructed to contact office with any additional concerns or updates. ER/RTC precautions were reviewed with the patient as applicable.   Please note: manual typing as well as voice recognition software may have been used to produce this document - typos may escape review. Please contact Dr. Sheppard Coil for any needed clarifications.

## 2020-04-20 ENCOUNTER — Ambulatory Visit: Payer: Federal, State, Local not specified - PPO | Admitting: Pulmonary Disease

## 2020-04-20 ENCOUNTER — Encounter: Payer: Self-pay | Admitting: Pulmonary Disease

## 2020-04-20 ENCOUNTER — Other Ambulatory Visit: Payer: Self-pay

## 2020-04-20 VITALS — BP 120/78 | HR 90 | Temp 98.0°F | Ht 64.0 in | Wt 204.0 lb

## 2020-04-20 DIAGNOSIS — R911 Solitary pulmonary nodule: Secondary | ICD-10-CM | POA: Diagnosis not present

## 2020-04-20 DIAGNOSIS — R059 Cough, unspecified: Secondary | ICD-10-CM | POA: Diagnosis not present

## 2020-04-20 DIAGNOSIS — J180 Bronchopneumonia, unspecified organism: Secondary | ICD-10-CM

## 2020-04-20 DIAGNOSIS — J479 Bronchiectasis, uncomplicated: Secondary | ICD-10-CM

## 2020-04-20 NOTE — Patient Instructions (Addendum)
Thank you for visiting Dr. Valeta Harms at [facility]. Today we recommend the following:  Orders Placed This Encounter  Procedures  . NM PET Image Initial (PI) Skull Base To Thigh  . Pulmonary Function Test   PET and PFTs can be completed within the next few weeks prior to next office appointment.    Return in about 4 weeks (around 05/18/2020) for with APP or Dr. Valeta Harms.    Please do your part to reduce the spread of COVID-19.

## 2020-04-20 NOTE — Progress Notes (Signed)
Synopsis: Referred in April 2022 for lung nodule by Emeterio Reeve, DO  Subjective:   PATIENT ID: Brianna Walton GENDER: female DOB: October 22, 1960, MRN: [id number]  Chief Complaint  Patient presents with  . Consult    Dry and productive cough in morning with clear mucus    This is a 60 year old female, lifelong non-smoker, hyperlipidemia, hypertension, obesity, BMI 35.  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  No other significant occupational or respiratory exposures.  She had initial CT scan imaging in October 2021.  This revealed a elongated nodular opacity within the right lower lobe measuring 9 x 8 x 2.6 cm.  It was previously described as 8 x 11 mm.  Also had other tiny pulmonary nodules.  She had subsequent follow-up CT imaging for evaluation.  CT scan was completed on 03/30/2020.  This CT revealed scattered areas in the right upper lobe with associated bronchiectasis and peribronchial infiltrates consistent with bronchopneumonia.  Patient states at the time 7 days prior to getting the CT scan she was sick and then treated with a azithromycin Z-Pak.  There was still redemonstration of the lung nodule within the azygoesophageal recess of the right base that was 2.1 x 1 1 cm in size that had slightly grown.  This was an just inferior to a location that shows an osteophyte coming off of thoracic disc that has associated areas of atelectasis.    Past Medical History:  Diagnosis Date  . At high risk for altered glucose metabolism   . Hyperlipidemia   . Hypertension      Family History  Problem Relation Age of Onset  . Diabetes Mother   . Hypertension Mother   . Kidney disease Mother   . Hypertension Father      Past Surgical History:  Procedure Laterality Date  . ABDOMINAL HYSTERECTOMY    . CHOLECYSTECTOMY      Social History   Socioeconomic History  . Marital status: Married    Spouse name: Not on file  . Number of children: Not on file  . Years of education: Not  on file  . Highest education level: Not on file  Occupational History  . Not on file  Tobacco Use  . Smoking status: Never Smoker  . Smokeless tobacco: Never Used  Substance and Sexual Activity  . Alcohol use: No  . Drug use: No  . Sexual activity: Yes    Birth control/protection: Other-see comments  Other Topics Concern  . Not on file  Social History Narrative  . Not on file   Social Determinants of Health   Financial Resource Strain: Not on file  Food Insecurity: Not on file  Transportation Needs: Not on file  Physical Activity: Not on file  Stress: Not on file  Social Connections: Not on file  Intimate Partner Violence: Not on file     Allergies  Allergen Reactions  . Azathioprine Other (See Comments)    Unknown reaction  . Cephalexin Hives and Rash  . Doxycycline Hives and Rash  . Hydrochlorothiazide Rash    Achy all over/Gout  Achy all over/Gout Achy all over/Gout  . Latex Rash  . Penicillins Rash, Hives, Itching and Swelling  . Acetaminophen Hives  . Atenolol Other (See Comments)    Achy.    . Labetalol Other (See Comments)     Outpatient Medications Prior to Visit  Medication Sig Dispense Refill  . albuterol (VENTOLIN HFA) 108 (90 Base) MCG/ACT inhaler Inhale 2 puffs into the  lungs every 6 (six) hours as needed for wheezing. 1 each 1  . amLODipine-benazepril (LOTREL) 10-40 MG capsule TAKE 1 CAPSULE BY MOUTH DAILY 90 capsule 1  . atorvastatin (LIPITOR) 40 MG tablet TAKE 1 TABLET(40 MG) BY MOUTH DAILY 90 tablet 3  . azithromycin (ZITHROMAX Z-PAK) 250 MG tablet Take two pills today followed by one a day until gone 6 tablet 0  . benzonatate (TESSALON) 200 MG capsule Take 1 capsule (200 mg total) by mouth 2 (two) times daily as needed for cough. 20 capsule 0  . cloNIDine (CATAPRES) 0.2 MG tablet Take 1 tablet (0.2 mg total) by mouth 2 (two) times daily. 180 tablet 1  . diclofenac Sodium (VOLTAREN) 1 % GEL APPLY 4 GRAMS TO AFFECTED JOINT FOUR TIMES DAILY 100 g  0  . spironolactone (ALDACTONE) 25 MG tablet TAKE 1 TABLET(25 MG) BY MOUTH DAILY 90 tablet 1  . triamcinolone cream (KENALOG) 0.1 % Apply 1 application topically 2 (two) times daily. To affected area(s) as needed 80 g 1  . fluticasone (FLONASE) 50 MCG/ACT nasal spray Place 2 sprays into both nostrils daily. (Patient not taking: Reported on 04/09/2020) 11.1 mL 2  . ibuprofen (ADVIL) 200 MG tablet Take 2 tablets by mouth every 8 (eight) hours as needed. (Patient not taking: Reported on 04/09/2020)    . Lidocaine 4 % PTCH Place 1 patch onto the skin daily as needed. (Patient not taking: Reported on 04/09/2020)    . meclizine (ANTIVERT) 25 MG tablet Take 1 tablet (25 mg total) by mouth 3 (three) times daily as needed for dizziness or nausea. (Patient not taking: Reported on 04/09/2020) 30 tablet 3  . metFORMIN (GLUCOPHAGE) 500 MG tablet Take 1 tablet (500 mg total) by mouth 2 (two) times daily with a meal. (Patient not taking: Reported on 04/09/2020) 180 tablet 3   No facility-administered medications prior to visit.    Review of Systems  Constitutional: Negative for chills, fever, malaise/fatigue and weight loss.  HENT: Negative for hearing loss, sore throat and tinnitus.   Eyes: Negative for blurred vision and double vision.  Respiratory: Negative for cough, hemoptysis, sputum production, shortness of breath, wheezing and stridor.   Cardiovascular: Negative for chest pain, palpitations, orthopnea, leg swelling and PND.  Gastrointestinal: Negative for abdominal pain, constipation, diarrhea, heartburn, nausea and vomiting.  Genitourinary: Negative for dysuria, hematuria and urgency.  Musculoskeletal: Negative for joint pain and myalgias.  Skin: Negative for itching and rash.  Neurological: Negative for dizziness, tingling, weakness and headaches.  Endo/Heme/Allergies: Negative for environmental allergies. Does not bruise/bleed easily.  Psychiatric/Behavioral: Negative for depression. The patient is  not nervous/anxious and does not have insomnia.   All other systems reviewed and are negative.    Objective:  Physical Exam Vitals reviewed.  Constitutional:      General: She is not in acute distress.    Appearance: She is well-developed.  HENT:     Head: Normocephalic and atraumatic.  Eyes:     General: No scleral icterus.    Conjunctiva/sclera: Conjunctivae normal.     Pupils: Pupils are equal, round, and reactive to light.  Neck:     Vascular: No JVD.     Trachea: No tracheal deviation.  Cardiovascular:     Rate and Rhythm: Normal rate and regular rhythm.     Heart sounds: Normal heart sounds. No murmur heard.   Pulmonary:     Effort: Pulmonary effort is normal. No tachypnea, accessory muscle usage or respiratory distress.  Breath sounds: No stridor. No wheezing, rhonchi or rales.  Abdominal:     General: Bowel sounds are normal. There is no distension.     Palpations: Abdomen is soft.     Tenderness: There is no abdominal tenderness.  Musculoskeletal:        General: No tenderness.     Cervical back: Neck supple.  Lymphadenopathy:     Cervical: No cervical adenopathy.  Skin:    General: Skin is warm and dry.     Capillary Refill: Capillary refill takes less than 2 seconds.     Findings: No rash.  Neurological:     Mental Status: She is alert and oriented to person, place, and time.  Psychiatric:        Behavior: Behavior normal.      Vitals:   04/20/20 1549  BP: 120/78  Pulse: 90  Temp: 98 F (36.7 C)  SpO2: 100%  Weight: 204 lb (92.5 kg)  Height: 5\' 4"  (1.626 m)   100% on RA BMI Readings from Last 3 Encounters:  04/20/20 35.02 kg/m  04/09/20 34.87 kg/m  01/03/20 35.90 kg/m   Wt Readings from Last 3 Encounters:  04/20/20 204 lb (92.5 kg)  04/09/20 203 lb 1.9 oz (92.1 kg)  01/03/20 209 lb 1.9 oz (94.9 kg)     CBC    Component Value Date/Time   WBC 8.8 07/23/2018 1102   RBC 5.41 (H) 07/23/2018 1102   HGB 13.3 07/23/2018 1102   HCT  40.9 07/23/2018 1102   PLT 398 07/23/2018 1102   MCV 75.6 (L) 07/23/2018 1102   MCH 24.6 (L) 07/23/2018 1102   MCHC 32.5 07/23/2018 1102   RDW 14.3 07/23/2018 1102   LYMPHSABS 3,094 02/08/2016 0805   MONOABS 819 02/08/2016 0805   EOSABS 637 (H) 02/08/2016 0805   BASOSABS 0 02/08/2016 0805    Chest Imaging: 03/30/2020: CT chest Right upper lobe infiltrates consistent with bronchopneumonia.  Patient has completed course of antibiotics. The right lower lobe nodule has slowly been enlarging over time now at 1.2 cm.  Patient is a non-smoker and this could represent a malignancy however I think she needs further work-up. The patient's images have been independently reviewed by me.    Pulmonary Functions Testing Results: No flowsheet data found.  FeNO:   Pathology:   Echocardiogram:   Heart Catheterization:     Assessment & Plan:     ICD-10-CM   1. Lung nodule  R91.1 Pulmonary Function Test    NM PET Image Initial (PI) Skull Base To Thigh  2. Bronchopneumonia  J18.0   3. Cough  R05.9   4. Bronchiectasis without complication ([place])  Q03.4     Discussion:  This is a 60 year old female, enlarging right lower lobe azygoesophageal recess pulmonary nodule.  Initially measured at 9 mm has slowly grown to 12 mm in largest cross-section.  It is just inferior to the area of atelectasis on CT imaging.  Her imaging is a little confusing as her recent CT has areas of the right upper lobe that appear infectious but she does admit to being sick with cough.  Consistent with a diagnosis of bronchopneumonia.  Overall clinically she has improved from this after completing course of antibiotics.  Plan: As for the lower lobe lung nodule we need more attention and close follow-up with this as this has documented growth. We will complete a nuclear medicine pet image. The lower lobe nodule if it continues to get bigger and happens to have  PET uptake I think we should consider options such as surgical  resection. Pending PET scan patient will likely need pulmonary function tests. I will talk with thoracic surgery and have them review images as well.  I explained to the patient that we could consider bronchoscopy however the location of the lesion is difficult to access via navigational bronchoscopy and would prefer to have a backup plan in place to consider surgical biopsy either directly or the same day if tissue biopsies from navigation were negative.   Current Outpatient Medications:  .  albuterol (VENTOLIN HFA) 108 (90 Base) MCG/ACT inhaler, Inhale 2 puffs into the lungs every 6 (six) hours as needed for wheezing., Disp: 1 each, Rfl: 1 .  amLODipine-benazepril (LOTREL) 10-40 MG capsule, TAKE 1 CAPSULE BY MOUTH DAILY, Disp: 90 capsule, Rfl: 1 .  atorvastatin (LIPITOR) 40 MG tablet, TAKE 1 TABLET(40 MG) BY MOUTH DAILY, Disp: 90 tablet, Rfl: 3 .  azithromycin (ZITHROMAX Z-PAK) 250 MG tablet, Take two pills today followed by one a day until gone, Disp: 6 tablet, Rfl: 0 .  benzonatate (TESSALON) 200 MG capsule, Take 1 capsule (200 mg total) by mouth 2 (two) times daily as needed for cough., Disp: 20 capsule, Rfl: 0 .  cloNIDine (CATAPRES) 0.2 MG tablet, Take 1 tablet (0.2 mg total) by mouth 2 (two) times daily., Disp: 180 tablet, Rfl: 1 .  diclofenac Sodium (VOLTAREN) 1 % GEL, APPLY 4 GRAMS TO AFFECTED JOINT FOUR TIMES DAILY, Disp: 100 g, Rfl: 0 .  spironolactone (ALDACTONE) 25 MG tablet, TAKE 1 TABLET(25 MG) BY MOUTH DAILY, Disp: 90 tablet, Rfl: 1 .  triamcinolone cream (KENALOG) 0.1 %, Apply 1 application topically 2 (two) times daily. To affected area(s) as needed, Disp: 80 g, Rfl: 1  I spent 62 minutes dedicated to the care of this patient on the date of this encounter to include pre-visit review of records, face-to-face time with the patient discussing conditions above, post visit ordering of testing, clinical documentation with the electronic health record, making appropriate referrals as  documented, and communicating necessary findings to members of the patients care team.   Garner Nash, [facility] [facility] 04/20/2020 4:02 PM

## 2020-04-21 ENCOUNTER — Telehealth: Payer: Self-pay | Admitting: Pulmonary Disease

## 2020-04-21 NOTE — Telephone Encounter (Signed)
Called pt & left her vm to call me.  I can reschedule PET or give her phone # for Central Scheduling.

## 2020-04-21 NOTE — Telephone Encounter (Signed)
Spoke to pt & she wants to reschedule PET herself.  Gave her phone # to [employer].  Nothing further needed.

## 2020-04-30 ENCOUNTER — Ambulatory Visit (HOSPITAL_COMMUNITY): Payer: Federal, State, Local not specified - PPO

## 2020-05-01 ENCOUNTER — Ambulatory Visit (HOSPITAL_COMMUNITY)
Admission: RE | Admit: 2020-05-01 | Discharge: 2020-05-01 | Disposition: A | Discharge status: Home / Self Care | Payer: Federal, State, Local not specified - PPO | Source: Ambulatory Visit | Attending: Pulmonary Disease | Admitting: Pulmonary Disease

## 2020-05-01 ENCOUNTER — Other Ambulatory Visit: Payer: Self-pay

## 2020-05-01 DIAGNOSIS — R911 Solitary pulmonary nodule: Secondary | ICD-10-CM | POA: Diagnosis not present

## 2020-05-01 LAB — GLUCOSE, CAPILLARY: Glucose-Capillary: 120 mg/dL — ABNORMAL HIGH (ref 70–99)

## 2020-05-01 MED ORDER — FLUDEOXYGLUCOSE F - 18 (FDG) INJECTION
10.1000 | Freq: Once | INTRAVENOUS | Status: AC | PRN
  Administered 2020-05-01: 10.4 via INTRAVENOUS

## 2020-05-05 ENCOUNTER — Ambulatory Visit: Payer: Federal, State, Local not specified - PPO | Admitting: Osteopathic Medicine

## 2020-05-27 ENCOUNTER — Ambulatory Visit: Payer: Federal, State, Local not specified - PPO | Admitting: Pulmonary Disease

## 2020-05-27 ENCOUNTER — Telehealth: Payer: Self-pay | Admitting: Pulmonary Disease

## 2020-05-27 DIAGNOSIS — R911 Solitary pulmonary nodule: Secondary | ICD-10-CM

## 2020-05-27 NOTE — Telephone Encounter (Signed)
Spoke with the pt  She is concerned that the PET done 05/01/20 not reviewed yet  She viewed the results on her mychart account but wants MD to advise any recs and interpret results  Please advise, thanks!

## 2020-05-27 NOTE — Telephone Encounter (Signed)
Spoke with the pt and notified of response per Dr Valeta Harms. She verbalized understanding. She would like to repeat the CT Chest in 6 months. Dr Valeta Harms, would you like for [place] to order under your name or defer to Dr Sheppard Coil? Looks like she had ordered last CT. Thanks!

## 2020-05-27 NOTE — Telephone Encounter (Signed)
The patient was scheduled for a follow up with me to review today. However, she has rescheduled her appt. She has an appt with Dr. Sheppard Coil tomorrow.   I think the lower lobe area of concern looks fine on the PET scan. No concern for malignancy. I think it would be ok for repeat ct imaging in 6 months if she wanted to be conservative and make sure the area stays the same. If not no additional follow up needed.   CC: Dr. Sheppard Coil  BLI    Garner Nash, DO [facility] 05/27/2020 12:22 PM

## 2020-05-28 ENCOUNTER — Ambulatory Visit (INDEPENDENT_AMBULATORY_CARE_PROVIDER_SITE_OTHER): Payer: Federal, State, Local not specified - PPO | Admitting: Osteopathic Medicine

## 2020-05-28 ENCOUNTER — Encounter: Payer: Self-pay | Admitting: Osteopathic Medicine

## 2020-05-28 ENCOUNTER — Other Ambulatory Visit: Payer: Self-pay

## 2020-05-28 VITALS — BP 144/92 | HR 82 | Temp 99.0°F | Wt 208.0 lb

## 2020-05-28 DIAGNOSIS — E1169 Type 2 diabetes mellitus with other specified complication: Secondary | ICD-10-CM

## 2020-05-28 DIAGNOSIS — I10 Essential (primary) hypertension: Secondary | ICD-10-CM | POA: Diagnosis not present

## 2020-05-28 DIAGNOSIS — E559 Vitamin D deficiency, unspecified: Secondary | ICD-10-CM

## 2020-05-28 DIAGNOSIS — E785 Hyperlipidemia, unspecified: Secondary | ICD-10-CM | POA: Diagnosis not present

## 2020-05-28 NOTE — Progress Notes (Signed)
Brianna Walton is a 60 y.o. female who presents to  [facility]  today, 05/28/20, seeking care for the following:  . Follow up diabetes - off medications at this time and doing well, A1C 6.7 . BP borderline in office, pt reports was rushing to get to appt, at home BP runs around 130/80s    BP Readings from Last 3 Encounters:  05/28/20 (!) 144/92  04/20/20 120/78  04/09/20 132/83    No results found for this or any previous visit (from the past 24 hour(s)).     ASSESSMENT & PLAN with other pertinent findings:  The primary encounter diagnosis was Type 2 diabetes mellitus with other specified complication, without long-term current use of insulin ([place]). Diagnoses of Vitamin D deficiency, Essential hypertension, and Dyslipidemia were also pertinent to this visit.   1. Type 2 diabetes mellitus with other specified complication, without long-term current use of insulin (H[facility]) Stable, pt would liekt o stay off Rx, reasonable given good control   2. Vitamin D deficiency Pt requests labs  3. Essential hypertension Discussed goals, consider Rx adjustment if BP >130/80 at home  4. Dyslipidemia Labs pending    There are no Patient Instructions on file for this visit.  Orders Placed This Encounter  Procedures  . CBC  . CMP14+EGFR  . Lipid panel  . VITAMIN D 25 Hydroxy (Vit-D Deficiency, Fractures)  . TSH  . POCT HgB A1C    No orders of the defined types were placed in this encounter.    See below for relevant physical exam findings  See below for recent lab and imaging results reviewed  Medications, allergies, PMH, PSH, SocH, FamH reviewed below    Follow-up instructions: Return in about 4 months (around 09/28/2020) for MONITOR A1C, SEE [place] SOONER IF NEEDED. CALL/MESSAGE W/ QUESTIONS.                                        Exam:  BP (!) 144/92 (BP Location: Left Arm, Patient  Position: Sitting, Cuff Size: Large)   Pulse 82   Temp 99 F (37.2 C) (Oral)   Wt 208 lb 0.6 oz (94.4 kg)   BMI 35.71 kg/m   Constitutional: VS see above. General Appearance: alert, well-developed, well-nourished, NAD  Neck: No masses, trachea midline.   Respiratory: Normal respiratory effort. no wheeze, no rhonchi, no rales  Cardiovascular: S1/S2 normal, no murmur, no rub/gallop auscultated. RRR.   Musculoskeletal: Gait normal. Symmetric and independent movement of all extremities  Neurological: Normal balance/coordination. No tremor.  Skin: warm, dry, intact.   Psychiatric: Normal judgment/insight. Normal mood and affect. Oriented x3.   Current Meds  Medication Sig  . amLODipine-benazepril (LOTREL) 10-40 MG capsule TAKE 1 CAPSULE BY MOUTH DAILY  . atorvastatin (LIPITOR) 40 MG tablet TAKE 1 TABLET(40 MG) BY MOUTH DAILY  . cloNIDine (CATAPRES) 0.2 MG tablet Take 1 tablet (0.2 mg total) by mouth 2 (two) times daily.    Allergies  Allergen Reactions  . Azathioprine Other (See Comments)    Unknown reaction  . Cephalexin Hives and Rash  . Doxycycline Hives and Rash  . Hydrochlorothiazide Rash    Achy all over/Gout    . Latex Rash  . Penicillins Rash, Hives, Itching and Swelling  . Acetaminophen Hives  . Atenolol Other (See Comments)    Achy.    . Labetalol Other (  See Comments)    Patient Active Problem List   Diagnosis Date Noted  . Gout 07/26/2018  . Primary osteoarthritis of left knee 10/12/2017  . Type 2 diabetes mellitus ([place]) 06/14/2017  . Neck pain on left side 07/08/2016  . Headache 07/08/2016  . 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 Proteinuria 09/10/2014  . Vitamin D deficiency 09/10/2014  . 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 Microalbuminuria 06/09/2014  . Lumbar radiculitis 08/08/2013  . Obesity, unspecified 05/08/2013  . Essential hypertension, benign 05/08/2013  . Dyslipidemia  05/08/2013  . Personal history of colonic polyps 05/07/2013    Family History  Problem Relation Age of Onset  . Diabetes Mother   . Hypertension Mother   . Kidney disease Mother   . Hypertension Father     Social History   Tobacco Use  Smoking Status Never Smoker  Smokeless Tobacco Never Used    Past Surgical History:  Procedure Laterality Date  . ABDOMINAL HYSTERECTOMY    . CHOLECYSTECTOMY      Immunization History  Administered Date(s) Administered  . Influenza,inj,Quad PF,6+ Mos 10/14/2017  . Influenza-Unspecified 10/17/2013, 11/09/2016, 11/02/2017, 11/12/2018, 11/12/2018, 11/14/2019  . PFIZER(Purple Top)SARS-COV-2 Vaccination 04/25/2019, 05/16/2019    Recent Results (from the past 2160 hour(s))  POC SARS Coronavirus 2 Ag-ED - Nasal Swab (BD Veritor Kit)     Status: None   Collection Time: 03/23/20  8:45 AM  Result Value Ref Range   SARS Coronavirus 2 Ag Negative Negative  Novel Coronavirus, NAA ([facility])     Status: None   Collection Time: 03/23/20  8:54 AM   Specimen: Nasopharyngeal(NP) swabs in vial transport medium   Nasopharynge  Result Value Ref Range   SARS-CoV-2, NAA Not Detected Not Detected    Comment: This nucleic acid amplification test was developed and its performance characteristics determined by [employer]. Nucleic acid amplification tests include RT-PCR and TMA. This test has not been FDA cleared or approved. This test has been authorized by FDA under an Emergency Use Authorization (EUA). This test is only authorized for the duration of time the declaration that circumstances exist justifying the authorization of the emergency use of in vitro diagnostic tests for detection of SARS-CoV-2 virus and/or diagnosis of COVID-19 infection under section 564(b)(1) of the Act, 21 U.S.C. 094BSJ-6(G) (1), unless the authorization is terminated or revoked sooner. When diagnostic testing is negative, the possibility of a false negative result  should be considered in the context of a patient's recent exposures and the presence of clinical signs and symptoms consistent with COVID-19. An individual without symptoms of COVID-19 and who is not shedding SARS-CoV-2 virus wo uld expect to have a negative (not detected) result in this assay.   SARS-COV-2, NAA 2 DAY TAT     Status: None   Collection Time: 03/23/20  8:54 AM   Nasopharynge  Result Value Ref Range   SARS-CoV-2, NAA 2 DAY TAT Performed   Glucose, capillary     Status: Abnormal   Collection Time: 05/01/20 12:22 PM  Result Value Ref Range   Glucose-Capillary 120 (H) 70 - 99 mg/dL    Comment: Glucose reference range applies only to samples taken after fasting for at least 8 hours.    No results found.     All questions at time of visit were answered - patient instructed to contact office with any additional concerns or updates. ER/RTC precautions were reviewed with the patient as applicable.  Please note: manual typing as well as voice recognition software may have been used to produce this document - typos may escape review. Please contact Dr. Alexander for any needed clarifications.    

## 2020-05-29 LAB — POCT GLYCOSYLATED HEMOGLOBIN (HGB A1C): Hemoglobin A1C: 6.7 % — AB (ref 4.0–5.6)

## 2020-05-29 NOTE — Telephone Encounter (Signed)
Called and spoke with patient. She stated that she was ok with BI ordering the test as long as she could have the test done at the [facility] location. I advised that we can order tests there. She verbalized understanding.   Order has been placed. Nothing further needed at time of call.

## 2020-06-11 ENCOUNTER — Encounter: Payer: Federal, State, Local not specified - PPO | Admitting: Osteopathic Medicine

## 2020-06-25 ENCOUNTER — Ambulatory Visit: Payer: Federal, State, Local not specified - PPO | Admitting: Pulmonary Disease

## 2020-06-25 ENCOUNTER — Other Ambulatory Visit: Payer: Self-pay | Admitting: Osteopathic Medicine

## 2020-06-25 DIAGNOSIS — I10 Essential (primary) hypertension: Secondary | ICD-10-CM

## 2020-08-16 ENCOUNTER — Other Ambulatory Visit: Payer: Self-pay | Admitting: Osteopathic Medicine

## 2020-10-01 ENCOUNTER — Ambulatory Visit (INDEPENDENT_AMBULATORY_CARE_PROVIDER_SITE_OTHER): Payer: Federal, State, Local not specified - PPO | Admitting: Osteopathic Medicine

## 2020-10-01 ENCOUNTER — Encounter: Payer: Self-pay | Admitting: Osteopathic Medicine

## 2020-10-01 VITALS — BP 128/86 | HR 89 | Temp 98.2°F | Ht 64.0 in | Wt 209.0 lb

## 2020-10-01 DIAGNOSIS — I10 Essential (primary) hypertension: Secondary | ICD-10-CM

## 2020-10-01 DIAGNOSIS — E1169 Type 2 diabetes mellitus with other specified complication: Secondary | ICD-10-CM | POA: Diagnosis not present

## 2020-10-01 DIAGNOSIS — Z1211 Encounter for screening for malignant neoplasm of colon: Secondary | ICD-10-CM

## 2020-10-01 DIAGNOSIS — E785 Hyperlipidemia, unspecified: Secondary | ICD-10-CM

## 2020-10-01 DIAGNOSIS — E559 Vitamin D deficiency, unspecified: Secondary | ICD-10-CM | POA: Diagnosis not present

## 2020-10-01 DIAGNOSIS — K635 Polyp of colon: Secondary | ICD-10-CM

## 2020-10-01 DIAGNOSIS — Z1231 Encounter for screening mammogram for malignant neoplasm of breast: Secondary | ICD-10-CM

## 2020-10-01 DIAGNOSIS — I700 Atherosclerosis of aorta: Secondary | ICD-10-CM

## 2020-10-01 LAB — POCT GLYCOSYLATED HEMOGLOBIN (HGB A1C): Hemoglobin A1C: 6.3 % — AB (ref 4.0–5.6)

## 2020-10-01 MED ORDER — CLONIDINE HCL 0.2 MG PO TABS: 0.2000 mg | ORAL_TABLET | Freq: Two times a day (BID) | ORAL | 3 refills | Status: DC

## 2020-10-01 MED ORDER — AMLODIPINE BESY-BENAZEPRIL HCL 10-40 MG PO CAPS: 1.0000 | ORAL_CAPSULE | Freq: Every day | ORAL | 3 refills | Status: DC

## 2020-10-01 MED ORDER — ATORVASTATIN CALCIUM 40 MG PO TABS: ORAL_TABLET | ORAL | 3 refills | Status: DC

## 2020-10-01 NOTE — Progress Notes (Signed)
Brianna Walton is a 60 y.o. female who presents to  [facility]  today, 10/01/20, seeking care for the following:  DM2: A1C today 6.3% last was 6.7 on 05/29/20     ASSESSMENT & PLAN with other pertinent findings:  The primary encounter diagnosis was Diet controlled diabetes. Diagnoses of Essential hypertension, benign, Dyslipidemia, Vitamin D deficiency, Essential hypertension, Breast cancer screening by mammogram, Colon cancer screening, Benign colon polyp, and Atherosclerosis of aorta ([place]) were also pertinent to this visit.   Doing well on current emds, sugars at goal    There are no Patient Instructions on file for this visit.  Orders Placed This Encounter  Procedures   MM 3D SCREEN BREAST BILATERAL   CBC   COMPLETE METABOLIC PANEL WITH GFR   Lipid panel   TSH   Vitamin D (25 hydroxy)   Ambulatory referral to Gastroenterology   POCT glycosylated hemoglobin (Hb A1C)    Meds ordered this encounter  Medications   amLODipine-benazepril (LOTREL) 10-40 MG capsule    Sig: Take 1 capsule by mouth daily.    Dispense:  90 capsule    Refill:  3   atorvastatin (LIPITOR) 40 MG tablet    Sig: TAKE 1 TABLET(40 MG) BY MOUTH DAILY    Dispense:  90 tablet    Refill:  3   cloNIDine (CATAPRES) 0.2 MG tablet    Sig: Take 1 tablet (0.2 mg total) by mouth 2 (two) times daily.    Dispense:  180 tablet    Refill:  3     See below for relevant physical exam findings  See below for recent lab and imaging results reviewed  Medications, allergies, PMH, PSH, SocH, FamH reviewed below    Follow-up instructions: Return for ROUTINE CHECK UP (OR SOONER IF NEEDED), MYCHART SET TO ALERT YOU TO CALL TO SCHEDULE.                                        Exam:  BP 128/86   Pulse 89   Temp 98.2 F (36.8 C)   Ht 5' 4"  (1.626 m)   Wt 209 lb (94.8 kg)   SpO2 99%   BMI 35.87 kg/m  Constitutional: VS  see above. General Appearance: alert, well-developed, well-nourished, NAD Neck: No masses, trachea midline.  Respiratory: Normal respiratory effort. no wheeze, no rhonchi, no rales Cardiovascular: S1/S2 normal, no murmur, no rub/gallop auscultated. RRR.  Musculoskeletal: Gait normal. Symmetric and independent movement of all extremities Neurological: Normal balance/coordination. No tremor. Skin: warm, dry, intact.  Psychiatric: Normal judgment/insight. Normal mood and affect. Oriented x3.   Current Meds  Medication Sig   [DISCONTINUED] amLODipine-benazepril (LOTREL) 10-40 MG capsule TAKE 1 CAPSULE BY MOUTH DAILY   [DISCONTINUED] atorvastatin (LIPITOR) 40 MG tablet TAKE 1 TABLET(40 MG) BY MOUTH DAILY   [DISCONTINUED] cloNIDine (CATAPRES) 0.2 MG tablet Take 1 tablet (0.2 mg total) by mouth 2 (two) times daily.    Allergies  Allergen Reactions   Azathioprine Other (See Comments)    Unknown reaction   Cephalexin Hives and Rash   Doxycycline Hives and Rash   Hydrochlorothiazide Rash    Achy all over/Gout     Latex Rash   Penicillins Rash, Hives, Itching and Swelling   Acetaminophen Hives   Atenolol Other (See Comments)    Achy.     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   Microalbuminuria 06/09/2014   Lumbar radiculitis 08/08/2013   Obesity, unspecified 05/08/2013   Essential hypertension, benign 05/08/2013   Dyslipidemia 05/08/2013   Personal history of colonic polyps 05/07/2013    Family History  Problem Relation  Age of Onset   Diabetes Mother    Hypertension Mother    Kidney disease Mother    Hypertension Father     Social History   Tobacco Use  Smoking Status Never  Smokeless Tobacco Never    Past Surgical History:  Procedure Laterality Date   ABDOMINAL HYSTERECTOMY     CHOLECYSTECTOMY      Immunization History  Administered Date(s) Administered   Influenza,inj,Quad PF,6+ Mos 10/14/2017   Influenza-Unspecified 10/17/2013, 11/09/2016, 11/02/2017, 11/12/2018, 11/12/2018, 11/14/2019   PFIZER(Purple Top)SARS-COV-2 Vaccination 04/25/2019, 05/16/2019    Recent Results (from the past 2160 hour(s))  CBC     Status: Abnormal   Collection Time: 10/01/20 12:00 AM  Result Value Ref Range   WBC 9.4 3.8 - 10.8 Thousand/uL   RBC 5.67 (H) 3.80 - 5.10 Million/uL   Hemoglobin 14.2 11.7 - 15.5 g/dL   HCT 42.2 35.0 - 45.0 %   MCV 74.4 (L) 80.0 - 100.0 fL   MCH 25.0 (L) 27.0 - 33.0 pg   MCHC 33.6 32.0 - 36.0 g/dL   RDW 14.7 11.0 - 15.0 %   Platelets 357 140 - 400 Thousand/uL   MPV 11.7 7.5 - 12.5 fL  COMPLETE METABOLIC PANEL WITH GFR     Status: Abnormal   Collection Time: 10/01/20 12:00 AM  Result Value Ref Range   Glucose, Bld 93 65 - 99 mg/dL    Comment: .            Fasting reference interval .    BUN 16 7 - 25 mg/dL   Creat 0.95 0.50 - 1.05 mg/dL   eGFR 68 > OR = 60 mL/min/1.19m    Comment: The eGFR is based on the CKD-EPI 2021 equation. To calculate  the new eGFR from a previous Creatinine or Cystatin C result, go to https://www.kidney.org/professionals/ kdoqi/gfr%5Fcalculator    BUN/Creatinine Ratio NOT APPLICABLE 6 - 22 (calc)   Sodium 139 135 - 146 mmol/L   Potassium 3.4 (L) 3.5 - 5.3 mmol/L   Chloride 100 98 - 110 mmol/L   CO2 30 20 - 32 mmol/L   Calcium 9.6 8.6 - 10.4 mg/dL   Total Protein 7.6 6.1 - 8.1 g/dL   Albumin 4.4 3.6 - 5.1 g/dL   Globulin 3.2 1.9 - 3.7 g/dL (calc)   AG Ratio 1.4 1.0 - 2.5 (calc)   Total Bilirubin 0.5 0.2 - 1.2 mg/dL   Alkaline phosphatase  (APISO) 111 37 - 153 U/L   AST 22 10 - 35 U/L   ALT 19 6 - 29 U/L  Lipid panel     Status: Abnormal   Collection Time: 10/01/20 12:00 AM  Result Value Ref Range   Cholesterol 159 <200 mg/dL   HDL 35 (L) > OR = 50 mg/dL   Triglycerides  197 (H) <150 mg/dL   LDL Cholesterol (Calc) 95 mg/dL (calc)    Comment: Reference range: <100 . Desirable range <100 mg/dL for primary prevention;   <70 mg/dL for patients with CHD or diabetic patients  with > or = 2 CHD risk factors. Marland Kitchen LDL-C is now calculated using the Martin-Hopkins  calculation, which is a validated novel method providing  better accuracy than the Friedewald equation in the  estimation of LDL-C.  Cresenciano Genre et al. Annamaria Helling. 8889;169(45): 2061-2068  (http://education.[facility].com/faq/FAQ164)    Total CHOL/HDL Ratio 4.5 <5.0 (calc)   Non-HDL Cholesterol (Calc) 124 <130 mg/dL (calc)    Comment: For patients with diabetes plus 1 major ASCVD risk  factor, treating to a non-HDL-C goal of <100 mg/dL  (LDL-C of <70 mg/dL) is considered a therapeutic  option.   TSH     Status: None   Collection Time: 10/01/20 12:00 AM  Result Value Ref Range   TSH 3.08 0.40 - 4.50 mIU/L  Vitamin D (25 hydroxy)     Status: Abnormal   Collection Time: 10/01/20 12:00 AM  Result Value Ref Range   Vit D, 25-Hydroxy 26 (L) 30 - 100 ng/mL    Comment: Vitamin D Status         25-OH Vitamin D: . Deficiency:                    <20 ng/mL Insufficiency:             20 - 29 ng/mL Optimal:                 > or = 30 ng/mL . For 25-OH Vitamin D testing on patients on  D2-supplementation and patients for whom quantitation  of D2 and D3 fractions is required, the QuestAssureD(TM) 25-OH VIT D, (D2,D3), LC/MS/MS is recommended: order  code [phone] (patients >80yr). See Note 1 . Note 1 . For additional information, please refer to  http://education.[facility].com/faq/FAQ199  (This link is being provided for informational/ educational purposes  only.)   POCT glycosylated hemoglobin (Hb A1C)     Status: Abnormal   Collection Time: 10/01/20  3:04 PM  Result Value Ref Range   Hemoglobin A1C 6.3 (A) 4.0 - 5.6 %   HbA1c POC (<> result, manual entry)     HbA1c, POC (prediabetic range)     HbA1c, POC (controlled diabetic range)      No results found.     All questions at time of visit were answered - patient instructed to contact office with any additional concerns or updates. ER/RTC precautions were reviewed with the patient as applicable.   Please note: manual typing as well as voice recognition software may have been used to produce this document - typos may escape review. Please contact Dr. ASheppard Coilfor any needed clarifications.

## 2020-10-02 LAB — COMPLETE METABOLIC PANEL WITH GFR
AG Ratio: 1.4 (calc) (ref 1.0–2.5)
ALT: 19 U/L (ref 6–29)
AST: 22 U/L (ref 10–35)
Albumin: 4.4 g/dL (ref 3.6–5.1)
Alkaline phosphatase (APISO): 111 U/L (ref 37–153)
BUN: 16 mg/dL (ref 7–25)
CO2: 30 mmol/L (ref 20–32)
Calcium: 9.6 mg/dL (ref 8.6–10.4)
Chloride: 100 mmol/L (ref 98–110)
Creat: 0.95 mg/dL (ref 0.50–1.05)
Globulin: 3.2 g/dL (calc) (ref 1.9–3.7)
Glucose, Bld: 93 mg/dL (ref 65–99)
Potassium: 3.4 mmol/L — ABNORMAL LOW (ref 3.5–5.3)
Sodium: 139 mmol/L (ref 135–146)
Total Bilirubin: 0.5 mg/dL (ref 0.2–1.2)
Total Protein: 7.6 g/dL (ref 6.1–8.1)
eGFR: 68 mL/min/{1.73_m2} (ref >=60)

## 2020-10-02 LAB — CBC
HCT: 42.2 % (ref 35.0–45.0)
Hemoglobin: 14.2 g/dL (ref 11.7–15.5)
MCH: 25 pg — ABNORMAL LOW (ref 27.0–33.0)
MCHC: 33.6 g/dL (ref 32.0–36.0)
MCV: 74.4 fL — ABNORMAL LOW (ref 80.0–100.0)
MPV: 11.7 fL (ref 7.5–12.5)
Platelets: 357 10*3/uL (ref 140–400)
RBC: 5.67 10*6/uL — ABNORMAL HIGH (ref 3.80–5.10)
RDW: 14.7 % (ref 11.0–15.0)
WBC: 9.4 10*3/uL (ref 3.8–10.8)

## 2020-10-02 LAB — VITAMIN D 25 HYDROXY (VIT D DEFICIENCY, FRACTURES): Vit D, 25-Hydroxy: 26 ng/mL — ABNORMAL LOW (ref 30–100)

## 2020-10-02 LAB — LIPID PANEL
Cholesterol: 159 mg/dL (ref <200)
HDL: 35 mg/dL — ABNORMAL LOW (ref >=50)
LDL Cholesterol (Calc): 95 mg/dL (calc)
Non-HDL Cholesterol (Calc): 124 mg/dL (calc) (ref <130)
Total CHOL/HDL Ratio: 4.5 (calc) (ref <5.0)
Triglycerides: 197 mg/dL — ABNORMAL HIGH (ref <150)

## 2020-10-02 LAB — TSH: TSH: 3.08 mIU/L (ref 0.40–4.50)

## 2020-12-01 ENCOUNTER — Other Ambulatory Visit: Payer: Federal, State, Local not specified - PPO

## 2020-12-04 ENCOUNTER — Other Ambulatory Visit: Payer: Self-pay

## 2020-12-04 ENCOUNTER — Ambulatory Visit (INDEPENDENT_AMBULATORY_CARE_PROVIDER_SITE_OTHER): Payer: Federal, State, Local not specified - PPO

## 2020-12-04 DIAGNOSIS — R911 Solitary pulmonary nodule: Secondary | ICD-10-CM | POA: Diagnosis not present

## 2020-12-12 NOTE — Progress Notes (Signed)
Please let her know that her nodule is stable and smaller in size. No additional follow up needed  Thanks,  BLI  Garner Nash, DO [facility] 12/12/2020 6:10 PM

## 2020-12-14 ENCOUNTER — Encounter: Payer: Self-pay | Admitting: Family Medicine

## 2020-12-14 ENCOUNTER — Encounter: Payer: Self-pay | Admitting: *Deleted

## 2020-12-14 ENCOUNTER — Other Ambulatory Visit: Payer: Self-pay

## 2020-12-14 ENCOUNTER — Ambulatory Visit (INDEPENDENT_AMBULATORY_CARE_PROVIDER_SITE_OTHER): Payer: Federal, State, Local not specified - PPO | Admitting: Family Medicine

## 2020-12-14 VITALS — BP 126/81 | HR 85 | Resp 17

## 2020-12-14 DIAGNOSIS — E1169 Type 2 diabetes mellitus with other specified complication: Secondary | ICD-10-CM

## 2020-12-14 MED ORDER — OZEMPIC (0.25 OR 0.5 MG/DOSE) 2 MG/1.5ML ~~LOC~~ SOPN: PEN_INJECTOR | SUBCUTANEOUS | 0 refills | Status: DC

## 2020-12-14 NOTE — Progress Notes (Signed)
Acute Office Visit  Subjective:    Patient ID: Brianna Walton, female    DOB: August 07, 1960, 60 y.o.   MRN: [id number]  Chief Complaint  Patient presents with   Diabetes    HPI Patient is in today for elevated blood sugars.  Patient is here today to discuss switching back to Ozempic. Reports that she was on it last year and it was doing great, but she read the label and some of the risks scared her so she stopped taking it over the summer and transitioned herself back to her old metformin 500 mg BID. She does not like taking the metformin because of GI side effects and she does not feel like it controls her glucose as well. Reports she takes her CBG about 3 mornings per week and fasting glucose levels have been in the 130's. She denies any polydipsia, polyuria, polyphagia, chest pain, dyspnea, vision changes, syncope, headaches, etc.   A1c in 09/2020 was 6.3%    Past Medical History:  Diagnosis Date   At high risk for altered glucose metabolism    Hyperlipidemia    Hypertension     Past Surgical History:  Procedure Laterality Date   ABDOMINAL HYSTERECTOMY     CHOLECYSTECTOMY      Family History  Problem Relation Age of Onset   Diabetes Mother    Hypertension Mother    Kidney disease Mother    Hypertension Father     Social History   Socioeconomic History   Marital status: Married    Spouse name: Not on file   Number of children: Not on file   Years of education: Not on file   Highest education level: Not on file  Occupational History   Not on file  Tobacco Use   Smoking status: Never   Smokeless tobacco: Never  Substance and Sexual Activity   Alcohol use: No   Drug use: No   Sexual activity: Yes    Birth control/protection: Other-see comments  Other Topics Concern   Not on file  Social History Narrative   Not on file   Social Determinants of Health   Financial Resource Strain: Not on file  Food Insecurity: Not on file  Transportation Needs: Not on file   Physical Activity: Not on file  Stress: Not on file  Social Connections: Not on file  Intimate Partner Violence: Not on file    Outpatient Medications Prior to Visit  Medication Sig Dispense Refill   amLODipine-benazepril (LOTREL) 10-40 MG capsule Take 1 capsule by mouth daily. 90 capsule 3   atorvastatin (LIPITOR) 40 MG tablet TAKE 1 TABLET(40 MG) BY MOUTH DAILY 90 tablet 3   cloNIDine (CATAPRES) 0.2 MG tablet Take 1 tablet (0.2 mg total) by mouth 2 (two) times daily. 180 tablet 3   No facility-administered medications prior to visit.    Allergies  Allergen Reactions   Azathioprine Other (See Comments)    Unknown reaction   Cephalexin Hives and Rash   Doxycycline Hives and Rash   Hydrochlorothiazide Rash    Achy all over/Gout     Latex Rash   Penicillins Rash, Hives, Itching and Swelling   Acetaminophen Hives   Atenolol Other (See Comments)    Achy.     Labetalol Other (See Comments)    Review of Systems All review of systems negative except what is listed in the HPI     Objective:    Physical Exam Vitals reviewed.  Constitutional:      Appearance: Normal  appearance.  HENT:     Head: Normocephalic and atraumatic.  Cardiovascular:     Rate and Rhythm: Normal rate and regular rhythm.     Heart sounds: Normal heart sounds.  Pulmonary:     Effort: Pulmonary effort is normal.     Breath sounds: Normal breath sounds.  Neurological:     General: No focal deficit present.     Mental Status: She is alert and oriented to person, place, and time. Mental status is at baseline.  Psychiatric:        Mood and Affect: Mood normal.        Behavior: Behavior normal.        Thought Content: Thought content normal.        Judgment: Judgment normal.    BP 126/81   Pulse 85   Resp 17   SpO2 98%  Wt Readings from Last 3 Encounters:  10/01/20 209 lb (94.8 kg)  05/28/20 208 lb 0.6 oz (94.4 kg)  04/20/20 204 lb (92.5 kg)    Health Maintenance Due  Topic Date Due    Pneumococcal Vaccine 32-77 Years old (1 - PCV) Never done   HIV Screening  Never done   Hepatitis C Screening  Never done   OPHTHALMOLOGY EXAM  11/16/2016   COVID-19 Vaccine (3 - Pfizer risk series) 06/13/2019   FOOT EXAM  07/23/2019   COLONOSCOPY (Pts 45-46yr Insurance coverage will need to be confirmed)  01/18/2020    There are no preventive care reminders to display for this patient.   Lab Results  Component Value Date   TSH 3.08 10/01/2020   Lab Results  Component Value Date   WBC 9.4 10/01/2020   HGB 14.2 10/01/2020   HCT 42.2 10/01/2020   MCV 74.4 (L) 10/01/2020   PLT 357 10/01/2020   Lab Results  Component Value Date   NA 139 10/01/2020   K 3.4 (L) 10/01/2020   CO2 30 10/01/2020   GLUCOSE 93 10/01/2020   BUN 16 10/01/2020   CREATININE 0.95 10/01/2020   BILITOT 0.5 10/01/2020   ALKPHOS 97 02/08/2016   AST 22 10/01/2020   ALT 19 10/01/2020   PROT 7.6 10/01/2020   ALBUMIN 4.1 02/08/2016   CALCIUM 9.6 10/01/2020   EGFR 68 10/01/2020   Lab Results  Component Value Date   CHOL 159 10/01/2020   Lab Results  Component Value Date   HDL 35 (L) 10/01/2020   Lab Results  Component Value Date   LDLCALC 95 10/01/2020   Lab Results  Component Value Date   TRIG 197 (H) 10/01/2020   Lab Results  Component Value Date   CHOLHDL 4.5 10/01/2020   Lab Results  Component Value Date   HGBA1C 6.3 (A) 10/01/2020       Assessment & Plan:   1. Type 2 diabetes mellitus with other specified complication, without long-term current use of insulin ([place] Restarting Ozempic. Discussed healthy, carb-modified diet, and exercise. She is due for another A1c check next month - can establish with new PCP at that time if able. Patient aware of signs/symptoms requiring further/urgent evaluation.  - Semaglutide,0.25 or 0.5MG/DOS, (OZEMPIC, 0.25 OR 0.5 MG/DOSE,) 2 MG/1.5ML SOPN; Inject 0.25 mg into the skin once a week for 28 days, THEN 0.5 mg once a week. Please dispense  appropriate needles #50.  Dispense: 5.25 mL; Refill: 0   Follow-up in about a month or sooner if needed.   TPurcell NailsBOlevia Bowens DNP, FNP-C

## 2020-12-21 ENCOUNTER — Other Ambulatory Visit: Payer: Self-pay | Admitting: Osteopathic Medicine

## 2020-12-30 ENCOUNTER — Other Ambulatory Visit: Payer: Self-pay

## 2020-12-30 MED ORDER — ATORVASTATIN CALCIUM 40 MG PO TABS: ORAL_TABLET | ORAL | 0 refills | Status: DC

## 2020-12-30 MED ORDER — AMLODIPINE BESY-BENAZEPRIL HCL 10-40 MG PO CAPS: 1.0000 | ORAL_CAPSULE | Freq: Every day | ORAL | 0 refills | Status: DC

## 2021-01-13 ENCOUNTER — Ambulatory Visit (INDEPENDENT_AMBULATORY_CARE_PROVIDER_SITE_OTHER): Payer: Federal, State, Local not specified - PPO

## 2021-01-13 ENCOUNTER — Other Ambulatory Visit: Payer: Self-pay

## 2021-01-13 DIAGNOSIS — Z1231 Encounter for screening mammogram for malignant neoplasm of breast: Secondary | ICD-10-CM

## 2021-01-19 ENCOUNTER — Ambulatory Visit: Payer: Federal, State, Local not specified - PPO | Admitting: Medical-Surgical

## 2021-01-26 ENCOUNTER — Encounter: Payer: Self-pay | Admitting: Internal Medicine

## 2021-01-28 ENCOUNTER — Ambulatory Visit: Payer: Federal, State, Local not specified - PPO | Admitting: Internal Medicine

## 2021-02-02 ENCOUNTER — Other Ambulatory Visit: Payer: Self-pay | Admitting: Family Medicine

## 2021-02-02 DIAGNOSIS — E1169 Type 2 diabetes mellitus with other specified complication: Secondary | ICD-10-CM

## 2021-02-11 ENCOUNTER — Ambulatory Visit: Payer: Federal, State, Local not specified - PPO | Admitting: Internal Medicine

## 2021-03-03 ENCOUNTER — Ambulatory Visit: Payer: Federal, State, Local not specified - PPO | Admitting: Medical-Surgical

## 2021-04-02 ENCOUNTER — Other Ambulatory Visit: Payer: Self-pay

## 2021-04-02 ENCOUNTER — Encounter: Payer: Self-pay | Admitting: Medical-Surgical

## 2021-04-02 ENCOUNTER — Ambulatory Visit (INDEPENDENT_AMBULATORY_CARE_PROVIDER_SITE_OTHER): Payer: Federal, State, Local not specified - PPO | Admitting: Medical-Surgical

## 2021-04-02 VITALS — BP 127/84 | HR 88 | Resp 16 | Ht 64.0 in | Wt 197.0 lb

## 2021-04-02 DIAGNOSIS — Z7689 Persons encountering health services in other specified circumstances: Secondary | ICD-10-CM

## 2021-04-02 DIAGNOSIS — I10 Essential (primary) hypertension: Secondary | ICD-10-CM | POA: Diagnosis not present

## 2021-04-02 DIAGNOSIS — E1169 Type 2 diabetes mellitus with other specified complication: Secondary | ICD-10-CM | POA: Diagnosis not present

## 2021-04-02 LAB — POCT GLYCOSYLATED HEMOGLOBIN (HGB A1C): Hemoglobin A1C: 5.6 % (ref 4.0–5.6)

## 2021-04-02 MED ORDER — OZEMPIC (0.25 OR 0.5 MG/DOSE) 2 MG/1.5ML ~~LOC~~ SOPN: PEN_INJECTOR | SUBCUTANEOUS | 3 refills | Status: DC

## 2021-04-02 MED ORDER — ATORVASTATIN CALCIUM 40 MG PO TABS: ORAL_TABLET | ORAL | 1 refills | Status: AC

## 2021-04-02 MED ORDER — CLONIDINE HCL 0.1 MG PO TABS: 0.1000 mg | ORAL_TABLET | Freq: Every day | ORAL | 3 refills | Status: AC

## 2021-04-02 MED ORDER — AMLODIPINE BESY-BENAZEPRIL HCL 10-40 MG PO CAPS: 1.0000 | ORAL_CAPSULE | Freq: Every day | ORAL | 1 refills | Status: DC

## 2021-04-02 NOTE — Progress Notes (Signed)
?  HPI with pertinent ROS:  ? ?CC: Transfer of care ? ?HPI: ?Pleasant 61 year old female presenting today to transfer care to new PCP and for the following: ? ?Hypertension-taking amlodipine-benazepril 10-40 mg daily, tolerating well without side effects.  Also taking clonidine 0.2 mg twice daily.  Would like to come off of clonidine if at all possible due to significant side effects of dry mouth and drowsiness.  She has been on this since 2015.  Notes significant allergies/intolerances to hydrochlorothiazide, labetalol, and atenolol. ? ?Diabetes-taking Ozempic 0.5 mg daily.  She has made multiple dietary changes.  Checking sugars at home with fasting readings in the 90s-100s.  Sometimes she has hypoglycemic episodes in the 70s where she becomes symptomatic with shakiness, jitteriness, and weakness.  These resolved quickly with p.o. intake. ? ?I reviewed the past medical history, family history, social history, surgical history, and allergies today and no changes were needed.  Please see the problem list section below in epic for further details. ? ? ?Physical exam:  ? ?General: Well Developed, well nourished, and in no acute distress.  ?Neuro: Alert and oriented x3.  ?HEENT: Normocephalic, atraumatic.  ?Skin: Warm and dry. ?Cardiac: Regular rate and rhythm, no murmurs rubs or gallops, no lower extremity edema.  ?Respiratory: Clear to auscultation bilaterally. Not using accessory muscles, speaking in full sentences. ? ?Impression and Recommendations:   ? ?1. Encounter to establish care ?Reviewed available information and discussed care concerns with patient.  ? ?2. Type 2 diabetes mellitus with other specified complication, without long-term current use of insulin ([place]) ?POCT hemoglobin A1c today 5.6%.  Well-controlled with use of semaglutide and dietary changes.  Continue semaglutide at 0.5 mg weekly. ?- POCT HgB A1C ?- Semaglutide,0.25 or 0.'5MG'$ /DOS, (OZEMPIC, 0.25 OR 0.5 MG/DOSE,) 2 MG/1.5ML SOPN; INJECT 0.25 MG  UNDER THE SKIN ONCE A WEEK FOR 28 DAYS, THEN 0.5 MG ONCE A WEEK  Dispense: 1.5 mL; Refill: 3 ? ?3. Essential hypertension, benign ?Blood pressure at goal currently however will require a taper slowly if she would like to come off clonidine.  We may have to add a different agent in its place however her intolerance/allergies will be difficult to work around.  For now lets reduce her clonidine to 0.1 mg in the morning and 0.2 mg at night to see if this helps with daytime sleepiness and dry mouth.  Continue amlodipine-benazepril as prescribed.  Continue Lipitor 40 mg daily. ?- amLODipine-benazepril (LOTREL) 10-40 MG capsule; Take 1 capsule by mouth daily.  Dispense: 90 capsule; Refill: 1 ?- atorvastatin (LIPITOR) 40 MG tablet; TAKE 1 TABLET(40 MG) BY MOUTH DAILY  Dispense: 90 tablet; Refill: 1 ? ?Return in about 2 weeks (around 04/16/2021) for nurse visit for BP check. ?___________________________________________ ?Clearnce Sorrel, DNP, APRN, FNP-BC ?Primary Care and Sports Medicine ?[place] ?

## 2021-04-16 ENCOUNTER — Ambulatory Visit: Payer: Federal, State, Local not specified - PPO

## 2021-09-03 ENCOUNTER — Other Ambulatory Visit: Payer: Self-pay | Admitting: Medical-Surgical

## 2021-09-03 DIAGNOSIS — E1169 Type 2 diabetes mellitus with other specified complication: Secondary | ICD-10-CM

## 2021-09-07 NOTE — Telephone Encounter (Signed)
Last office visit 04/02/2021  Last filled 04/02/2021  No upcoming appointment scheduled

## 2021-09-07 NOTE — Telephone Encounter (Signed)
Needs follow up for further refills.

## 2021-09-08 NOTE — Telephone Encounter (Signed)
Spoke with patient's husband as he stated that patient has changed her phone number and patient's husband said he would have patient to call us back to schedule an appt. AMUCK

## 2021-09-08 NOTE — Telephone Encounter (Signed)
Called x2, phone rings twice and hangs up on me. Will attempt again later. AMUCK

## 2021-09-28 ENCOUNTER — Other Ambulatory Visit: Payer: Self-pay | Admitting: Family Medicine

## 2021-09-28 DIAGNOSIS — I10 Essential (primary) hypertension: Secondary | ICD-10-CM

## 2021-12-23 ENCOUNTER — Other Ambulatory Visit: Payer: Self-pay

## 2021-12-23 DIAGNOSIS — I10 Essential (primary) hypertension: Secondary | ICD-10-CM

## 2021-12-23 MED ORDER — AMLODIPINE BESY-BENAZEPRIL HCL 10-40 MG PO CAPS: 1.0000 | ORAL_CAPSULE | Freq: Every day | ORAL | 0 refills | Status: DC

## 2021-12-23 NOTE — Progress Notes (Signed)
Patient needs an appointment for further refills on amlodipine-benazepril.  Last office visit 04/02/2021  Last filled 04/02/2021  30 day supply sent to the pharmacy. No refills.

## 2021-12-27 ENCOUNTER — Other Ambulatory Visit: Payer: Self-pay | Admitting: Family Medicine

## 2021-12-27 DIAGNOSIS — Z1231 Encounter for screening mammogram for malignant neoplasm of breast: Secondary | ICD-10-CM

## 2022-04-26 ENCOUNTER — Other Ambulatory Visit: Payer: Self-pay

## 2022-04-26 DIAGNOSIS — I10 Essential (primary) hypertension: Secondary | ICD-10-CM

## 2022-04-26 MED ORDER — AMLODIPINE BESY-BENAZEPRIL HCL 10-40 MG PO CAPS
1.0000 | ORAL_CAPSULE | Freq: Every day | ORAL | 0 refills | Status: AC
Start: 2022-04-26 — End: ?
